# Patient Record
Sex: Female | Born: 1975 | Race: Black or African American | Hispanic: No | Smoking: Current every day smoker
Health system: Southern US, Community
[De-identification: ages and names within clinical notes are randomized; demographics above are authoritative.]

## PROBLEM LIST (undated history)

## (undated) DIAGNOSIS — F410 Panic disorder [episodic paroxysmal anxiety] without agoraphobia: Secondary | ICD-10-CM

## (undated) DIAGNOSIS — R51 Headache: Secondary | ICD-10-CM

## (undated) DIAGNOSIS — K259 Gastric ulcer, unspecified as acute or chronic, without hemorrhage or perforation: Secondary | ICD-10-CM

## (undated) DIAGNOSIS — F419 Anxiety disorder, unspecified: Secondary | ICD-10-CM

## (undated) DIAGNOSIS — F329 Major depressive disorder, single episode, unspecified: Secondary | ICD-10-CM

## (undated) DIAGNOSIS — F32A Depression, unspecified: Secondary | ICD-10-CM

## (undated) DIAGNOSIS — I10 Essential (primary) hypertension: Secondary | ICD-10-CM

## (undated) DIAGNOSIS — R519 Headache, unspecified: Secondary | ICD-10-CM

## (undated) DIAGNOSIS — A6000 Herpesviral infection of urogenital system, unspecified: Secondary | ICD-10-CM

## (undated) DIAGNOSIS — A539 Syphilis, unspecified: Secondary | ICD-10-CM

## (undated) HISTORY — PX: DILATION AND CURETTAGE OF UTERUS: SHX78

---
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
## 1999-02-06 ENCOUNTER — Emergency Department (HOSPITAL_COMMUNITY): Admission: EM | Admit: 1999-02-06 | Discharge: 1999-02-06 | Payer: Self-pay | Admitting: *Deleted

## 1999-09-25 ENCOUNTER — Emergency Department (HOSPITAL_COMMUNITY): Admission: EM | Admit: 1999-09-25 | Discharge: 1999-09-25 | Payer: Self-pay | Admitting: Emergency Medicine

## 1999-09-27 ENCOUNTER — Emergency Department (HOSPITAL_COMMUNITY): Admission: EM | Admit: 1999-09-27 | Discharge: 1999-09-27 | Payer: Self-pay | Admitting: Emergency Medicine

## 1999-09-27 ENCOUNTER — Emergency Department (HOSPITAL_COMMUNITY): Admission: EM | Admit: 1999-09-27 | Discharge: 1999-09-27 | Payer: Self-pay

## 2001-08-20 DIAGNOSIS — I10 Essential (primary) hypertension: Secondary | ICD-10-CM

## 2001-08-20 HISTORY — DX: Essential (primary) hypertension: I10

## 2001-10-24 ENCOUNTER — Encounter: Payer: Self-pay | Admitting: *Deleted

## 2001-10-24 ENCOUNTER — Inpatient Hospital Stay (HOSPITAL_COMMUNITY): Admission: AD | Admit: 2001-10-24 | Discharge: 2001-10-24 | Payer: Self-pay | Admitting: *Deleted

## 2001-11-18 ENCOUNTER — Inpatient Hospital Stay (HOSPITAL_COMMUNITY): Admission: AD | Admit: 2001-11-18 | Discharge: 2001-11-18 | Payer: Self-pay | Admitting: *Deleted

## 2002-06-11 ENCOUNTER — Inpatient Hospital Stay (HOSPITAL_COMMUNITY): Admission: AD | Admit: 2002-06-11 | Discharge: 2002-06-13 | Payer: Self-pay | Admitting: Obstetrics

## 2003-07-21 ENCOUNTER — Emergency Department (HOSPITAL_COMMUNITY): Admission: EM | Admit: 2003-07-21 | Discharge: 2003-07-21 | Payer: Self-pay | Admitting: Emergency Medicine

## 2003-07-28 ENCOUNTER — Emergency Department (HOSPITAL_COMMUNITY): Admission: EM | Admit: 2003-07-28 | Discharge: 2003-07-28 | Payer: Self-pay | Admitting: Emergency Medicine

## 2003-09-09 ENCOUNTER — Emergency Department (HOSPITAL_COMMUNITY): Admission: EM | Admit: 2003-09-09 | Discharge: 2003-09-09 | Payer: Self-pay | Admitting: Emergency Medicine

## 2004-08-12 ENCOUNTER — Emergency Department (HOSPITAL_COMMUNITY): Admission: EM | Admit: 2004-08-12 | Discharge: 2004-08-12 | Payer: Self-pay | Admitting: Emergency Medicine

## 2005-12-03 ENCOUNTER — Inpatient Hospital Stay (HOSPITAL_COMMUNITY): Admission: AD | Admit: 2005-12-03 | Discharge: 2005-12-03 | Payer: Self-pay | Admitting: Obstetrics

## 2005-12-03 ENCOUNTER — Encounter: Payer: Self-pay | Admitting: Emergency Medicine

## 2005-12-06 ENCOUNTER — Inpatient Hospital Stay (HOSPITAL_COMMUNITY): Admission: AD | Admit: 2005-12-06 | Discharge: 2005-12-06 | Payer: Self-pay | Admitting: Obstetrics

## 2005-12-09 ENCOUNTER — Inpatient Hospital Stay (HOSPITAL_COMMUNITY): Admission: AD | Admit: 2005-12-09 | Discharge: 2005-12-09 | Payer: Self-pay | Admitting: Obstetrics

## 2005-12-12 ENCOUNTER — Inpatient Hospital Stay (HOSPITAL_COMMUNITY): Admission: AD | Admit: 2005-12-12 | Discharge: 2005-12-13 | Payer: Self-pay | Admitting: Obstetrics

## 2005-12-12 ENCOUNTER — Inpatient Hospital Stay (HOSPITAL_COMMUNITY): Admission: AD | Admit: 2005-12-12 | Discharge: 2005-12-12 | Payer: Self-pay | Admitting: Obstetrics

## 2005-12-15 ENCOUNTER — Inpatient Hospital Stay (HOSPITAL_COMMUNITY): Admission: AD | Admit: 2005-12-15 | Discharge: 2005-12-15 | Payer: Self-pay | Admitting: Obstetrics

## 2005-12-22 ENCOUNTER — Inpatient Hospital Stay (HOSPITAL_COMMUNITY): Admission: AD | Admit: 2005-12-22 | Discharge: 2005-12-22 | Payer: Self-pay | Admitting: Obstetrics

## 2005-12-29 ENCOUNTER — Inpatient Hospital Stay (HOSPITAL_COMMUNITY): Admission: AD | Admit: 2005-12-29 | Discharge: 2005-12-29 | Payer: Self-pay | Admitting: Obstetrics

## 2006-03-23 ENCOUNTER — Inpatient Hospital Stay (HOSPITAL_COMMUNITY): Admission: AD | Admit: 2006-03-23 | Discharge: 2006-03-23 | Payer: Self-pay | Admitting: Obstetrics

## 2006-05-14 ENCOUNTER — Inpatient Hospital Stay (HOSPITAL_COMMUNITY): Admission: AD | Admit: 2006-05-14 | Discharge: 2006-05-14 | Payer: Self-pay | Admitting: Obstetrics

## 2006-10-14 ENCOUNTER — Inpatient Hospital Stay (HOSPITAL_COMMUNITY): Admission: AD | Admit: 2006-10-14 | Discharge: 2006-10-14 | Payer: Self-pay | Admitting: Obstetrics

## 2006-11-12 ENCOUNTER — Inpatient Hospital Stay (HOSPITAL_COMMUNITY): Admission: AD | Admit: 2006-11-12 | Discharge: 2006-11-15 | Payer: Self-pay | Admitting: Obstetrics

## 2008-08-20 ENCOUNTER — Emergency Department (HOSPITAL_COMMUNITY): Admission: EM | Admit: 2008-08-20 | Discharge: 2008-08-20 | Payer: Self-pay | Admitting: Emergency Medicine

## 2008-12-24 ENCOUNTER — Emergency Department (HOSPITAL_COMMUNITY): Admission: EM | Admit: 2008-12-24 | Discharge: 2008-12-25 | Payer: Self-pay | Admitting: Emergency Medicine

## 2009-08-29 ENCOUNTER — Emergency Department (HOSPITAL_COMMUNITY): Admission: EM | Admit: 2009-08-29 | Discharge: 2009-08-29 | Payer: Self-pay | Admitting: Emergency Medicine

## 2009-09-16 ENCOUNTER — Emergency Department (HOSPITAL_COMMUNITY): Admission: EM | Admit: 2009-09-16 | Discharge: 2009-09-16 | Payer: Self-pay | Admitting: Emergency Medicine

## 2010-06-01 ENCOUNTER — Emergency Department (HOSPITAL_COMMUNITY): Admission: EM | Admit: 2010-06-01 | Discharge: 2010-06-01 | Payer: Self-pay | Admitting: Emergency Medicine

## 2011-05-22 ENCOUNTER — Emergency Department (HOSPITAL_COMMUNITY)
Admission: EM | Admit: 2011-05-22 | Discharge: 2011-05-22 | Disposition: A | Discharge status: Home / Self Care | Payer: BC Managed Care – PPO | Attending: Emergency Medicine | Admitting: Emergency Medicine

## 2011-05-22 DIAGNOSIS — B9789 Other viral agents as the cause of diseases classified elsewhere: Secondary | ICD-10-CM | POA: Insufficient documentation

## 2011-05-22 DIAGNOSIS — F17200 Nicotine dependence, unspecified, uncomplicated: Secondary | ICD-10-CM | POA: Insufficient documentation

## 2011-05-22 DIAGNOSIS — J029 Acute pharyngitis, unspecified: Secondary | ICD-10-CM | POA: Insufficient documentation

## 2011-05-25 ENCOUNTER — Inpatient Hospital Stay (INDEPENDENT_AMBULATORY_CARE_PROVIDER_SITE_OTHER)
Admission: RE | Admit: 2011-05-25 | Discharge: 2011-05-25 | Disposition: A | Discharge status: Home / Self Care | Payer: BC Managed Care – PPO | Source: Ambulatory Visit | Attending: Family Medicine | Admitting: Family Medicine

## 2011-05-25 DIAGNOSIS — R509 Fever, unspecified: Secondary | ICD-10-CM

## 2011-05-25 DIAGNOSIS — J029 Acute pharyngitis, unspecified: Secondary | ICD-10-CM

## 2011-05-25 LAB — POCT PREGNANCY, URINE: Preg Test, Ur: NEGATIVE

## 2011-06-05 ENCOUNTER — Encounter: Payer: Self-pay | Admitting: *Deleted

## 2011-06-05 ENCOUNTER — Emergency Department (HOSPITAL_BASED_OUTPATIENT_CLINIC_OR_DEPARTMENT_OTHER)
Admission: EM | Admit: 2011-06-05 | Discharge: 2011-06-05 | Disposition: A | Discharge status: Home / Self Care | Payer: BC Managed Care – PPO | Attending: Emergency Medicine | Admitting: Emergency Medicine

## 2011-06-05 DIAGNOSIS — IMO0001 Reserved for inherently not codable concepts without codable children: Secondary | ICD-10-CM | POA: Insufficient documentation

## 2011-06-05 DIAGNOSIS — M791 Myalgia, unspecified site: Secondary | ICD-10-CM

## 2011-06-05 DIAGNOSIS — R51 Headache: Secondary | ICD-10-CM | POA: Insufficient documentation

## 2011-06-05 DIAGNOSIS — R110 Nausea: Secondary | ICD-10-CM | POA: Insufficient documentation

## 2011-06-05 MED ORDER — KETOROLAC TROMETHAMINE 30 MG/ML IJ SOLN
30.0000 mg | Freq: Once | INTRAMUSCULAR | Status: AC
  Administered 2011-06-05: 30 mg via INTRAMUSCULAR
  Filled 2011-06-05: qty 1

## 2011-06-05 MED ORDER — DIPHENHYDRAMINE HCL 50 MG/ML IJ SOLN
12.5000 mg | Freq: Once | INTRAMUSCULAR | Status: AC
  Administered 2011-06-05: 12.5 mg via INTRAMUSCULAR
  Filled 2011-06-05: qty 1

## 2011-06-05 MED ORDER — PROMETHAZINE HCL 25 MG/ML IJ SOLN
12.5000 mg | Freq: Once | INTRAMUSCULAR | Status: AC
  Administered 2011-06-05: 12.5 mg via INTRAMUSCULAR
  Filled 2011-06-05: qty 1

## 2011-06-05 MED ORDER — BUTALBITAL-APAP-CAFFEINE 50-325-40 MG PO TABS: 1.0000 | ORAL_TABLET | Freq: Four times a day (QID) | ORAL | Status: DC | PRN

## 2011-06-05 NOTE — ED Notes (Signed)
Resting quietly.states feels better pain now 4/10

## 2011-06-05 NOTE — ED Provider Notes (Signed)
History     CSN: [id number] Arrival date & time: 06/05/2011  1:27 AM  Chief Complaint  Patient presents with  . Generalized Body Aches    (Consider location/radiation/quality/duration/timing/severity/associated sxs/prior treatment) HPI Comments: Patient presents tonight complaining of 3 weeks of multiple symptoms.  She states that she has had fevers and chills for the last 3 weeks that she cannot control with Tylenol and ibuprofen.  She also claims she's had sore throat and cough.  She mentions that she was seen in the [facility] emergency department and diagnosed with viral pharyngitis.  On review of her prior medical record she did have a strep screen completed and it was negative.  Patient also complains of persistent nausea and vomiting.  Notably she was brought in by EMS tonight and there was no vomiting during her transport and has been no vomiting here in the emergency department.  Patient also complains of headaches.  She also complains of diffuse myalgias.  On further review of her medical record she has had another visit to [facility] where she had a chest x-ray completed and this was also normal.  Patient states that Tylenol and ibuprofen did not control her pain or her fevers.  She had been given some hydrocodone elixir for her sore throat which she stated also has not helped.  Patient is a 35 y.o. female presenting with fever. The history is provided by the patient.  Fever Primary symptoms of the febrile illness include fever, headaches, cough, nausea, vomiting and myalgias. Primary symptoms do not include visual change, dysuria, altered mental status or rash. The current episode started more than 1 week ago. The problem has not changed since onset. The headache is not associated with visual change.    History reviewed. No pertinent past medical history.  Past Surgical History  Procedure Date  . Appendectomy     No family history on file.  History  Substance Use  Topics  . Smoking status: Current Everyday Smoker  . Smokeless tobacco: Not on file  . Alcohol Use:     OB History    Grav Para Term Preterm Abortions TAB SAB Ect Mult Living   2 2              Review of Systems  Constitutional: Positive for fever and chills.  HENT: Positive for sore throat.   Eyes: Negative.   Respiratory: Positive for cough.   Gastrointestinal: Positive for nausea and vomiting.  Genitourinary: Negative.  Negative for dysuria, vaginal discharge and pelvic pain.  Musculoskeletal: Positive for myalgias.  Skin: Negative.  Negative for rash.  Neurological: Positive for headaches.  Hematological: Negative.   Psychiatric/Behavioral: Negative.  Negative for altered mental status.  All other systems reviewed and are negative.    Allergies  Review of patient's allergies indicates no known allergies.  Home Medications  No current outpatient prescriptions on file.  BP 148/69  Pulse 89  Temp 98.6 F (37 C)  Resp 16  SpO2 99%  Physical Exam  Constitutional: She is oriented to person, place, and time. She appears well-developed and well-nourished.  Non-toxic appearance. She does not have a sickly appearance.       Patient is lying on the stretcher with her arm over her eyes.  During the entire time in the room the patient never removes her arm from her face.  HENT:  Head: Normocephalic and atraumatic.  Eyes: Conjunctivae, EOM and lids are normal. Pupils are equal, round, and reactive to light.  No scleral icterus.  Neck: Trachea normal and normal range of motion. Neck supple.  Cardiovascular: Normal rate and regular rhythm.  Exam reveals no gallop and no friction rub.   No murmur heard. Pulmonary/Chest: Effort normal and breath sounds normal. No respiratory distress. She has no wheezes. She has no rales. She exhibits no tenderness.  Abdominal: Soft. Normal appearance. There is no tenderness. There is no rebound, no guarding and no CVA tenderness.  Genitourinary:     Pelvic exam was performed with patient supine. There is lesion on the right labia. There is no rash, tenderness or injury on the right labia. There is tenderness on the left labia. There is no rash, lesion or injury on the left labia.       Examination chaperoned byRN  Musculoskeletal: Normal range of motion.  Neurological: She is alert and oriented to person, place, and time. She has normal strength.  Skin: Skin is warm, dry and intact. No rash noted.  Psychiatric: She has a normal mood and affect. Her behavior is normal. Judgment and thought content normal.    ED Course  Procedures (including critical care time)  Labs Reviewed - No data to display No results found.   No diagnosis found.    MDM  Patient feels much better after the medications here.  She is now opening her eyes and has decreased headache and no nausea.  Of note patient has never had any episodes of emesis during her stay here in the ER.  Patient has appeared well and without any significant signs of neck stiffness, fevers, neurologic deficits to suggest that this is a cold meningitis.  Patient is no findings consistent with pharyngitis at this time.  She has clear lungs and no signs of pneumonia.  Patient has a benign abdomen and does not appear to have an acute surgical process in her abdomen at this time.  Patient denies any urinary symptoms.  She does secondarily bring up to bumps on her genital region.  These appear to be any face folliculitis with no signs of other redness or herpes-type lesions.  Patient is being counseled her primary care physician and a primary gynecologist for annual Pap smears.  She understands this and will do so.        Nat Christen, MD 06/05/11 [phone]

## 2011-06-05 NOTE — ED Notes (Signed)
Pt presents to ED today via [facility] for cold/flu like sx for the past 3 weeks.  Pt has been seen at [facility] and [facility] for same.

## 2011-06-05 NOTE — ED Notes (Signed)
Also reports having blisterd on groin area

## 2011-06-11 ENCOUNTER — Encounter (HOSPITAL_COMMUNITY): Payer: Self-pay | Admitting: *Deleted

## 2011-06-11 ENCOUNTER — Inpatient Hospital Stay (HOSPITAL_COMMUNITY)
Admission: AD | Admit: 2011-06-11 | Discharge: 2011-06-11 | Disposition: A | Discharge status: Home / Self Care | Payer: BC Managed Care – PPO | Source: Ambulatory Visit | Attending: Obstetrics & Gynecology | Admitting: Obstetrics & Gynecology

## 2011-06-11 DIAGNOSIS — N898 Other specified noninflammatory disorders of vagina: Secondary | ICD-10-CM | POA: Insufficient documentation

## 2011-06-11 DIAGNOSIS — A6000 Herpesviral infection of urogenital system, unspecified: Secondary | ICD-10-CM | POA: Insufficient documentation

## 2011-06-11 DIAGNOSIS — B009 Herpesviral infection, unspecified: Secondary | ICD-10-CM

## 2011-06-11 LAB — URINALYSIS, ROUTINE W REFLEX MICROSCOPIC
Bilirubin Urine: NEGATIVE
Ketones, ur: 15 mg/dL — AB
Urobilinogen, UA: 0.2 mg/dL (ref 0.0–1.0)

## 2011-06-11 LAB — WET PREP, GENITAL
Trich, Wet Prep: NONE SEEN
Yeast Wet Prep HPF POC: NONE SEEN

## 2011-06-11 LAB — POCT PREGNANCY, URINE: Preg Test, Ur: NEGATIVE

## 2011-06-11 LAB — URINE MICROSCOPIC-ADD ON

## 2011-06-11 MED ORDER — VALACYCLOVIR HCL 1 G PO TABS: 1000.0000 mg | ORAL_TABLET | Freq: Two times a day (BID) | ORAL | Status: DC

## 2011-06-11 MED ORDER — VALACYCLOVIR HCL 1 G PO TABS: 1000.0000 mg | ORAL_TABLET | Freq: Every day | ORAL | Status: DC

## 2011-06-11 MED ORDER — OXYCODONE-ACETAMINOPHEN 5-325 MG PO TABS: 1.0000 | ORAL_TABLET | Freq: Four times a day (QID) | ORAL | Status: DC | PRN

## 2011-06-11 NOTE — ED Provider Notes (Signed)
History     Chief Complaint  Patient presents with  . Vaginal Discharge   HPI Blisters in vaginal area x 1 week. Painful. No period since stopping depo 4 months ago.   OB History    Grav Para Term Preterm Abortions TAB SAB Ect Mult Living   6 2   4 3  1  2       No past medical history on file.  Past Surgical History  Procedure Date  . Appendectomy     No family history on file.  History  Substance Use Topics  . Smoking status: Current Everyday Smoker  . Smokeless tobacco: Not on file  . Alcohol Use:     Allergies: No Known Allergies  Prescriptions prior to admission  Medication Sig Dispense Refill  . butalbital-acetaminophen-caffeine (FIORICET) 50-325-40 MG per tablet Take 1 tablet by mouth every 6 (six) hours as needed for headache.  10 tablet  0    Review of Systems  Constitutional: Negative.   Respiratory: Negative.   Cardiovascular: Negative.   Gastrointestinal: Negative for nausea, vomiting, abdominal pain, diarrhea and constipation.  Genitourinary: Positive for dysuria. Negative for urgency, frequency, hematuria and flank pain.       Negative for vaginal bleeding, vaginal discharge, dyspareunia, + vulvar lesions   Musculoskeletal: Negative.   Neurological: Negative.   Psychiatric/Behavioral: Negative.    Physical Exam   Blood pressure 124/80, pulse 84, temperature 98.4 F (36.9 C), temperature source Oral, resp. rate 20, height 5\' 4"  (1.626 m), weight 61.689 kg (136 lb).  Physical Exam  Nursing note and vitals reviewed. Constitutional: She is oriented to person, place, and time. She appears well-developed and well-nourished. No distress.  HENT:  Head: Normocephalic and atraumatic.  Cardiovascular: Normal rate.   Respiratory: Effort normal. No respiratory distress.  GI: Soft. She exhibits no distension and no mass. There is no tenderness. There is no rebound and no guarding.  Genitourinary:    There is lesion (ulcerated, painful) on the right  labia. There is no rash on the right labia. There is lesion (ulcerated, painful) on the left labia. There is no rash on the left labia. Cervix exhibits motion tenderness. No erythema, tenderness or bleeding around the vagina. No vaginal discharge found.       Speculum exam deferred d/t pain - wet prep collected, GC/CT collected from urine  Lymphadenopathy:       Right: No inguinal adenopathy present.       Left: No inguinal adenopathy present.  Neurological: She is alert and oriented to person, place, and time.  Skin: Skin is warm and dry.  Psychiatric: She has a normal mood and affect.    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

## 2011-06-11 NOTE — Progress Notes (Signed)
Burning when urinates.  Hasn't had a cycle in months.  Has been on depo, last injection  ~ 4 months, was on for a year.  Down on vagina, noted blisters- painful to use restroom.

## 2011-06-12 LAB — GC PROBE AMPLIFICATION, URINE: GC Probe Amp, Urine: NEGATIVE

## 2011-06-13 LAB — HERPES SIMPLEX VIRUS CULTURE: Special Requests: NORMAL

## 2011-10-23 ENCOUNTER — Emergency Department (HOSPITAL_COMMUNITY)
Admission: EM | Admit: 2011-10-23 | Discharge: 2011-10-23 | Disposition: A | Discharge status: Home / Self Care | Payer: BC Managed Care – PPO | Attending: Emergency Medicine | Admitting: Emergency Medicine

## 2011-10-23 ENCOUNTER — Encounter (HOSPITAL_COMMUNITY): Payer: Self-pay | Admitting: *Deleted

## 2011-10-23 DIAGNOSIS — J020 Streptococcal pharyngitis: Secondary | ICD-10-CM | POA: Insufficient documentation

## 2011-10-23 DIAGNOSIS — F17200 Nicotine dependence, unspecified, uncomplicated: Secondary | ICD-10-CM | POA: Insufficient documentation

## 2011-10-23 MED ORDER — DEXAMETHASONE SODIUM PHOSPHATE 10 MG/ML IJ SOLN
10.0000 mg | Freq: Once | INTRAMUSCULAR | Status: AC
  Administered 2011-10-23: 10 mg via INTRAMUSCULAR
  Filled 2011-10-23: qty 1

## 2011-10-23 MED ORDER — IBUPROFEN 800 MG PO TABS
800.0000 mg | ORAL_TABLET | Freq: Once | ORAL | Status: AC
  Administered 2011-10-23: 800 mg via ORAL
  Filled 2011-10-23: qty 1

## 2011-10-23 MED ORDER — PENICILLIN G BENZATHINE 1200000 UNIT/2ML IM SUSP
1.2000 10*6.[IU] | Freq: Once | INTRAMUSCULAR | Status: AC
  Administered 2011-10-23: 1.2 10*6.[IU] via INTRAMUSCULAR
  Filled 2011-10-23: qty 2

## 2011-10-23 NOTE — ED Provider Notes (Signed)
Medical screening examination/treatment/procedure(s) were performed by non-physician practitioner and as supervising physician I was immediately available for consultation/collaboration.  Nicholes Stairs, MD 10/23/11 [phone]

## 2011-10-23 NOTE — Discharge Instructions (Signed)
Strep Throat Strep throat is an infection of the throat caused by a bacteria named Streptococcus pyogenes. Your caregiver may call the infection streptococcal "tonsillitis" or "pharyngitis" depending on whether there are signs of inflammation in the tonsils or back of the throat. Strep throat is most common in children from 5 to 34 years old during the cold months of the year, but it can occur in people of any age during any season. This infection is spread from person to person (contagious) through coughing, sneezing, or other close contact. SYMPTOMS   Fever or chills.   Painful, swollen, red tonsils or throat.   Pain or difficulty when swallowing.   White or yellow spots on the tonsils or throat.   Swollen, tender lymph nodes or "glands" of the neck or under the jaw.   Red rash all over the body (rare).  DIAGNOSIS  Many different infections can cause the same symptoms. A test must be done to confirm the diagnosis so the right treatment can be given. A "rapid strep test" can help your caregiver make the diagnosis in a few minutes. If this test is not available, a light swab of the infected area can be used for a throat culture test. If a throat culture test is done, results are usually available in a day or two. TREATMENT  Strep throat is treated with antibiotic medicine. HOME CARE INSTRUCTIONS   Gargle with 1 tsp of salt in 1 cup of warm water, 3 to 4 times per day or as needed for comfort.   Family members who also have a sore throat or fever should be tested for strep throat and treated with antibiotics if they have the strep infection.   Make sure everyone in your household washes their hands well.   Do not share food, drinking cups, or personal items that could cause the infection to spread to others.   You may need to eat a soft food diet until your sore throat gets better.   Drink enough water and fluids to keep your urine clear or pale yellow. This will help prevent  dehydration.   Get plenty of rest.   Stay home from school, daycare, or work until you have been on antibiotics for 24 hours.   Only take over-the-counter or prescription medicines for pain, discomfort, or fever as directed by your caregiver.   If antibiotics are prescribed, take them as directed. Finish them even if you start to feel better.  SEEK MEDICAL CARE IF:   The glands in your neck continue to enlarge.   You develop a rash, cough, or earache.   You cough up green, yellow-brown, or bloody sputum.   You have pain or discomfort not controlled by medicines.   Your problems seem to be getting worse rather than better.  SEEK IMMEDIATE MEDICAL CARE IF:   You develop any new symptoms such as vomiting, severe headache, stiff or painful neck, chest pain, shortness of breath, or trouble swallowing.   You develop severe throat pain, drooling, or changes in your voice.   You develop swelling of the neck, or the skin on the neck becomes red and tender.   You have a fever.   You develop signs of dehydration, such as fatigue, dry mouth, and decreased urination.   You become increasingly sleepy, or you cannot wake up completely.  Document Released: 08/03/2000 Document Revised: 07/26/2011 Document Reviewed: 10/05/2010 Exit[employer] Patient Information 2012 [employer].Salt Water Gargle This solution will help make your mouth and throat   feel better. HOME CARE INSTRUCTIONS   Mix 1 teaspoon of salt in 8 ounces of warm water.   Gargle with this solution as much or often as you need or as directed. Swish and gargle gently if you have any sores or wounds in your mouth.   Do not swallow this mixture.  Document Released: 05/10/2004 Document Revised: 07/26/2011 Document Reviewed: 10/01/2008 [employer] Patient Information 2012 [employer]. 

## 2011-10-23 NOTE — ED Provider Notes (Signed)
History     CSN: [id number]  Arrival date & time 10/23/11  1619   First MD Initiated Contact with Patient 10/23/11 1731      Chief Complaint  Patient presents with  . Sore Throat    (Consider location/radiation/quality/duration/timing/severity/associated sxs/prior treatment) HPI Comments: Patient here with worsening sore throat - states that this initially started as "cold like symptoms" - states worsening nasal congestion and sore throat over the past several days - no recent sick contacts - reports fever chills, denies cough or sputum production,  Patient is a 36 y.o. female presenting with pharyngitis. The history is provided by the patient. No language interpreter was used.  Sore Throat This is a new problem. The current episode started in the past 7 days. The problem occurs constantly. The problem has been unchanged. Associated symptoms include congestion, a fever, headaches, a sore throat and swollen glands. Pertinent negatives include no abdominal pain, anorexia, arthralgias, change in bowel habit, chest pain, chills, coughing, diaphoresis, fatigue, joint swelling, myalgias, nausea, neck pain, numbness, rash, urinary symptoms, vertigo, visual change, vomiting or weakness. The symptoms are aggravated by swallowing. She has tried nothing for the symptoms. The treatment provided no relief.    Past Medical History  Diagnosis Date  . No pertinent past medical history     Past Surgical History  Procedure Date  . Appendectomy   . Dilation and curettage of uterus     History reviewed. No pertinent family history.  History  Substance Use Topics  . Smoking status: Current Everyday Smoker  . Smokeless tobacco: Not on file  . Alcohol Use: No    OB History    Grav Para Term Preterm Abortions TAB SAB Ect Mult Living   6 2   4 3  1  2       Review of Systems  Constitutional: Positive for fever. Negative for chills, diaphoresis and fatigue.  HENT: Positive for congestion and  sore throat. Negative for neck pain.   Respiratory: Negative for cough.   Cardiovascular: Negative for chest pain.  Gastrointestinal: Negative for nausea, vomiting, abdominal pain, anorexia and change in bowel habit.  Musculoskeletal: Negative for myalgias, joint swelling and arthralgias.  Skin: Negative for rash.  Neurological: Positive for headaches. Negative for vertigo, weakness and numbness.  All other systems reviewed and are negative.    Allergies  Review of patient's allergies indicates no known allergies.  Home Medications   Current Outpatient Rx  Name Route Sig Dispense Refill  . ACETAMINOPHEN 500 MG PO TABS Oral Take 1,000 mg by mouth every 6 (six) hours as needed. For pain. Taking for fever.    . IBUPROFEN 200 MG PO TABS Oral Take 800 mg by mouth every 6 (six) hours as needed. Patient states that she used this medication for pain of swollen glands in neck.      Marland Kitchen VALACYCLOVIR HCL 1 G PO TABS Oral Take 1,000 mg by mouth daily. Take daily for prevention of outbreaks.      BP 139/80  Pulse 85  Temp(Src) 99 F (37.2 C) (Oral)  Resp 20  SpO2 100%  Physical Exam  Nursing note and vitals reviewed. Constitutional: She is oriented to person, place, and time. She appears well-developed and well-nourished. No distress.  HENT:  Head: Normocephalic and atraumatic.  Right Ear: External ear normal.  Left Ear: External ear normal.  Mouth/Throat: Oropharyngeal exudate present.       Enlarged tonsils with exudate and erythema - nasal bogginess.  Eyes: Conjunctivae  are normal. Pupils are equal, round, and reactive to light. No scleral icterus.  Neck: Normal range of motion. Neck supple.       Anterior cervical adenopathy  Cardiovascular: Normal rate, regular rhythm and normal heart sounds.  Exam reveals no gallop and no friction rub.   No murmur heard. Pulmonary/Chest: Effort normal and breath sounds normal. No respiratory distress. She has no wheezes. She has no rales. She  exhibits no tenderness.  Abdominal: Soft. Bowel sounds are normal. She exhibits no distension. There is no tenderness.  Musculoskeletal: Normal range of motion. She exhibits no edema and no tenderness.  Lymphadenopathy:    She has cervical adenopathy.  Neurological: She is alert and oriented to person, place, and time. No cranial nerve deficit.  Skin: Skin is warm and dry. No rash noted. No erythema. No pallor.  Psychiatric: She has a normal mood and affect. Her behavior is normal. Judgment and thought content normal.    ED Course  Procedures (including critical care time)  Labs Reviewed - No data to display No results found.   Strep pharyngitis    MDM  Patient with a CENTOR score of 4/4 - will treat for strep - she would like an injection, so given bicillin LA 1.2 million units - also given an injection of decadron IM for pain control.       Izola Price [place], [place] 10/23/11 [phone]

## 2011-10-23 NOTE — ED Notes (Signed)
Pt in c/o sore throat x1.5 weeks

## 2011-10-23 NOTE — ED Notes (Signed)
Pt reports sore throat x 1.5 week.  Denies any fever.

## 2011-11-08 ENCOUNTER — Emergency Department (INDEPENDENT_AMBULATORY_CARE_PROVIDER_SITE_OTHER)
Admission: EM | Admit: 2011-11-08 | Discharge: 2011-11-08 | Disposition: A | Discharge status: Home / Self Care | Payer: BC Managed Care – PPO | Source: Home / Self Care | Attending: Family Medicine | Admitting: Family Medicine

## 2011-11-08 ENCOUNTER — Encounter (HOSPITAL_COMMUNITY): Payer: Self-pay | Admitting: Emergency Medicine

## 2011-11-08 DIAGNOSIS — Z8719 Personal history of other diseases of the digestive system: Secondary | ICD-10-CM

## 2011-11-08 MED ORDER — HYDROCODONE-ACETAMINOPHEN 5-325 MG PO TABS: 1.0000 | ORAL_TABLET | Freq: Four times a day (QID) | ORAL | Status: AC | PRN

## 2011-11-08 NOTE — ED Notes (Signed)
Pt c/o mouth sores X 4 days. Pt stated she had strep last week. Pt tried antiseptic mouth cleanser and Oragel, no relief.

## 2011-11-08 NOTE — ED Provider Notes (Signed)
History     CSN: [id number]  Arrival date & time 11/08/11  1521   First MD Initiated Contact with Patient 11/08/11 1533      Chief Complaint  Patient presents with  . Mouth Lesions    (Consider location/radiation/quality/duration/timing/severity/associated sxs/prior treatment) HPI Comments: The patient reports having painful oral lesions x 4 days. Has had similar episodes in the past. Has hx of genital herpes and take valtrex daily. No fever, no cough or cold symptoms.   The history is provided by the patient.    Past Medical History  Diagnosis Date  . No pertinent past medical history   . Herpes     Past Surgical History  Procedure Date  . Appendectomy   . Dilation and curettage of uterus     History reviewed. No pertinent family history.  History  Substance Use Topics  . Smoking status: Current Everyday Smoker -- 1.0 packs/day    Types: Cigarettes  . Smokeless tobacco: Not on file  . Alcohol Use: No    OB History    Grav Para Term Preterm Abortions TAB SAB Ect Mult Living   6 2   4 3  1  2       Review of Systems  Constitutional: Negative.   HENT: Positive for mouth sores.   Respiratory: Negative.   Cardiovascular: Negative.   Gastrointestinal: Negative.   Genitourinary: Negative.     Allergies  Review of patient's allergies indicates no known allergies.  Home Medications   Current Outpatient Rx  Name Route Sig Dispense Refill  . ACETAMINOPHEN 500 MG PO TABS Oral Take 1,000 mg by mouth every 6 (six) hours as needed. For pain. Taking for fever.    . IBUPROFEN 200 MG PO TABS Oral Take 800 mg by mouth every 6 (six) hours as needed. Patient states that she used this medication for pain of swollen glands in neck.      Marland Kitchen VALACYCLOVIR HCL 1 G PO TABS Oral Take 1,000 mg by mouth daily. Take daily for prevention of outbreaks.      BP 129/85  Pulse 80  Temp(Src) 100.8 F (38.2 C) (Oral)  Resp 14  SpO2 97%  LMP 10/15/2011  Physical Exam  Nursing  note and vitals reviewed. Constitutional: She appears well-developed and well-nourished. No distress.  HENT:  Head: Atraumatic.       Multiple ulcers on mucosa of lip , tongue consistent with aphthous ulcers.   Neck: Normal range of motion. Neck supple.  Cardiovascular: Normal rate and regular rhythm.   Pulmonary/Chest: Effort normal and breath sounds normal.  Abdominal: Soft. Bowel sounds are normal.  Skin: Skin is warm and dry.    ED Course  Procedures (including critical car  Labs Reviewed - No data to display No results found.  Painted the lesions with viscus xylocaine 1. H/O oral aphthous ulcers       MDM          Randa Spike, MD 11/08/11 1626

## 2011-11-08 NOTE — Discharge Instructions (Signed)
Apply the medication given to you as needed before meals and at bedtime. Avoid spicy foods and salty foods.

## 2012-03-27 ENCOUNTER — Other Ambulatory Visit: Payer: Self-pay | Admitting: Obstetrics

## 2012-04-02 ENCOUNTER — Ambulatory Visit (HOSPITAL_COMMUNITY): Admission: EM | Admit: 2012-04-02 | Payer: BC Managed Care – PPO | Source: Ambulatory Visit | Admitting: Obstetrics

## 2012-04-02 ENCOUNTER — Encounter (HOSPITAL_COMMUNITY): Admission: EM | Payer: Self-pay | Source: Ambulatory Visit

## 2012-04-02 SURGERY — LIGATION, FALLOPIAN TUBE, LAPAROSCOPIC
Anesthesia: General | Laterality: Bilateral

## 2012-08-12 ENCOUNTER — Other Ambulatory Visit: Payer: Self-pay | Admitting: Advanced Practice Midwife

## 2012-08-14 ENCOUNTER — Other Ambulatory Visit: Payer: Self-pay | Admitting: *Deleted

## 2012-08-14 NOTE — Telephone Encounter (Signed)
Called Michele Yu as requested and left message that her refill has been sent to the pharmacy- call if any questions

## 2012-08-14 NOTE — Telephone Encounter (Signed)
Need to call patient re: refill

## 2012-08-19 ENCOUNTER — Emergency Department (HOSPITAL_COMMUNITY)
Admission: EM | Admit: 2012-08-19 | Discharge: 2012-08-20 | Disposition: A | Discharge status: Home / Self Care | Payer: BC Managed Care – PPO | Attending: Emergency Medicine | Admitting: Emergency Medicine

## 2012-08-19 ENCOUNTER — Encounter (HOSPITAL_COMMUNITY): Payer: Self-pay | Admitting: *Deleted

## 2012-08-19 DIAGNOSIS — F17200 Nicotine dependence, unspecified, uncomplicated: Secondary | ICD-10-CM | POA: Insufficient documentation

## 2012-08-19 DIAGNOSIS — Z8619 Personal history of other infectious and parasitic diseases: Secondary | ICD-10-CM | POA: Insufficient documentation

## 2012-08-19 DIAGNOSIS — Z9889 Other specified postprocedural states: Secondary | ICD-10-CM | POA: Insufficient documentation

## 2012-08-19 DIAGNOSIS — R509 Fever, unspecified: Secondary | ICD-10-CM | POA: Insufficient documentation

## 2012-08-19 DIAGNOSIS — N938 Other specified abnormal uterine and vaginal bleeding: Secondary | ICD-10-CM | POA: Insufficient documentation

## 2012-08-19 DIAGNOSIS — N949 Unspecified condition associated with female genital organs and menstrual cycle: Secondary | ICD-10-CM | POA: Insufficient documentation

## 2012-08-19 DIAGNOSIS — Z3202 Encounter for pregnancy test, result negative: Secondary | ICD-10-CM | POA: Insufficient documentation

## 2012-08-19 NOTE — ED Notes (Signed)
Pt c/o vaginal bleeding, starting as a normal period and then began passing a large clot the size of a "baseball". Pt denies prior sxs of excessive vaginal bleeding. Pt drove self to hospital. Pt denies pain, n/v/d.

## 2012-08-19 NOTE — ED Provider Notes (Signed)
History     CSN: [id number]  Arrival date & time 08/19/12  2120   First MD Initiated Contact with Patient 08/19/12 2234      Chief Complaint  Patient presents with  . Vaginal Bleeding  . Fever    (Consider location/radiation/quality/duration/timing/severity/associated sxs/prior treatment) HPI History provided by pt.   Pt reports heavy vaginal bleeding w/ fist-sized clots since this afternoon.  LMP 2 weeks ago and patients are typically regular and not heavy.  No associated abdominal pain, other GU sx, lightheadedness, generalized weakness or SOB.  Pt is not anti-coagulated.  Denies trauma to genitalia.  Past Medical History  Diagnosis Date  . No pertinent past medical history   . Herpes     Past Surgical History  Procedure Date  . Appendectomy   . Dilation and curettage of uterus     History reviewed. No pertinent family history.  History  Substance Use Topics  . Smoking status: Current Every Day Smoker -- 1.0 packs/day    Types: Cigarettes  . Smokeless tobacco: Not on file  . Alcohol Use: No    OB History    Grav Para Term Preterm Abortions TAB SAB Ect Mult Living   6 2   4 3  1  2       Review of Systems  All other systems reviewed and are negative.    Allergies  Review of patient's allergies indicates no known allergies.  Home Medications   Current Outpatient Rx  Name  Route  Sig  Dispense  Refill  . ACETAMINOPHEN 500 MG PO TABS   Oral   Take 1,000 mg by mouth every 6 (six) hours as needed. For pain.         . IBUPROFEN 200 MG PO TABS   Oral   Take 800 mg by mouth every 8 (eight) hours as needed. Patient states that she used this medication for pain of swollen glands in neck.          . ADULT MULTIVITAMIN W/MINERALS CH   Oral   Take 1 tablet by mouth daily.         Marland Kitchen VALACYCLOVIR HCL 1 G PO TABS   Oral   Take 1,000 mg by mouth daily.           BP 129/82  Pulse 120  Temp 100.1 F (37.8 C) (Oral)  Resp 20  SpO2 100%  LMP  06/15/2012  Physical Exam  Nursing note and vitals reviewed. Constitutional: She is oriented to person, place, and time. She appears well-developed and well-nourished. No distress.  HENT:  Head: Normocephalic and atraumatic.  Eyes:       Normal appearance  Neck: Normal range of motion.  Cardiovascular: Normal rate and regular rhythm.   Pulmonary/Chest: Effort normal and breath sounds normal. No respiratory distress.  Abdominal: Soft. Bowel sounds are normal. She exhibits no distension and no mass. There is no tenderness. There is no rebound and no guarding.  Genitourinary:       No CVA tenderness.  Large clots externally and within vagina.  Exam limited by blood and patients intolerance.  Right adnexal ttp.   Musculoskeletal: Normal range of motion.  Neurological: She is alert and oriented to person, place, and time.  Skin: Skin is warm and dry. No rash noted.  Psychiatric: She has a normal mood and affect. Her behavior is normal.    ED Course  Procedures (including critical care time)  Labs Reviewed  BASIC METABOLIC PANEL - Abnormal;  Notable for the following:    Potassium 3.3 (*)     Glucose, Bld 105 (*)     All other components within normal limits  PREGNANCY, URINE  CBC WITH DIFFERENTIAL   US Transvaginal Non-ob  08/20/2012  *RADIOLOGY REPORT*  Clinical Data: Heavy vaginal bleeding and right adnexal tenderness.  TRANSABDOMINAL AND TRANSVAGINAL ULTRASOUND OF PELVIS Technique:  Both transabdominal and transvaginal ultrasound examinations of the pelvis were performed. Transabdominal technique was performed for global imaging of the pelvis including uterus, ovaries, adnexal regions, and pelvic cul-de-sac.  It was necessary to proceed with endovaginal exam following the transabdominal exam to visualize the uterus and ovaries in greater detail.  Comparison:  Pelvic ultrasound performed 03/23/2006  Findings:  Uterus: Normal in size and appearance; measures 7.1 x 3.1 x 4.4 cm.   Endometrium: Normal in thickness and appearance; measures 0.3 cm in thickness.  Right ovary:  Normal appearance/no adnexal mass, though difficult to fully characterize due to its lateral position; measures 3.0 x 2.4 x 1.8 cm.  Left ovary: Normal appearance/no adnexal mass, though difficult to characterize due to its lateral position; measures approximately 2.5 cm in size.  Other findings: No free fluid is seen in the pelvic cul-de-sac.  IMPRESSION: Normal study.  No evidence of pelvic mass or other significant abnormality.  Right ovary unremarkable in appearance.   Original Report Authenticated By: Tonia Ghent, M.D.    US Pelvis Complete  08/20/2012  *RADIOLOGY REPORT*  Clinical Data: Heavy vaginal bleeding and right adnexal tenderness.  TRANSABDOMINAL AND TRANSVAGINAL ULTRASOUND OF PELVIS Technique:  Both transabdominal and transvaginal ultrasound examinations of the pelvis were performed. Transabdominal technique was performed for global imaging of the pelvis including uterus, ovaries, adnexal regions, and pelvic cul-de-sac.  It was necessary to proceed with endovaginal exam following the transabdominal exam to visualize the uterus and ovaries in greater detail.  Comparison:  Pelvic ultrasound performed 03/23/2006  Findings:  Uterus: Normal in size and appearance; measures 7.1 x 3.1 x 4.4 cm.  Endometrium: Normal in thickness and appearance; measures 0.3 cm in thickness.  Right ovary:  Normal appearance/no adnexal mass, though difficult to fully characterize due to its lateral position; measures 3.0 x 2.4 x 1.8 cm.  Left ovary: Normal appearance/no adnexal mass, though difficult to characterize due to its lateral position; measures approximately 2.5 cm in size.  Other findings: No free fluid is seen in the pelvic cul-de-sac.  IMPRESSION: Normal study.  No evidence of pelvic mass or other significant abnormality.  Right ovary unremarkable in appearance.   Original Report Authenticated By: Tonia Ghent, M.D.        1. DUB (dysfunctional uterine bleeding)       MDM  Healthy 36yo F presents w/ heavy vaginal bleeding.  Exam limited but is sig for normal vital signs, heavy vaginal bleeding, R adnexal ttp, benign abdomen.  She is not pregnant.  Has had no symptoms of anemia and hgb nml.  Transvaginal US unremarkable.  All results discussed w/ patient.  D/c'd home w/ 10d course of provera.  I advised close f/u with her gynecologist.  Return precautions including syncope, lightheadedness, SOB discussed.           Otilio Miu, PA-C 08/20/12 [phone]

## 2012-08-20 ENCOUNTER — Emergency Department (HOSPITAL_COMMUNITY): Payer: BC Managed Care – PPO

## 2012-08-20 LAB — BASIC METABOLIC PANEL
CO2: 27 mEq/L (ref 19–32)
Chloride: 100 mEq/L (ref 96–112)
Creatinine, Ser: 0.65 mg/dL (ref 0.50–1.10)
GFR calc Af Amer: >90 mL/min (ref >90)
Sodium: 136 mEq/L (ref 135–145)

## 2012-08-20 LAB — CBC WITH DIFFERENTIAL/PLATELET
Basophils Absolute: 0 10*3/uL (ref 0.0–0.1)
Basophils Relative: 0 % (ref 0–1)
Lymphocytes Relative: 33 % (ref 12–46)
MCHC: 34.8 g/dL (ref 30.0–36.0)
Neutro Abs: 5.7 10*3/uL (ref 1.7–7.7)
Neutrophils Relative %: 55 % (ref 43–77)
Platelets: 306 10*3/uL (ref 150–400)
RDW: 12.9 % (ref 11.5–15.5)
WBC: 10.3 10*3/uL (ref 4.0–10.5)

## 2012-08-20 LAB — PREGNANCY, URINE: Preg Test, Ur: NEGATIVE

## 2012-08-20 MED ORDER — MEDROXYPROGESTERONE ACETATE 5 MG PO TABS: 5.0000 mg | ORAL_TABLET | Freq: Every day | ORAL | Status: DC

## 2012-08-20 NOTE — ED Notes (Signed)
PA speaking with pt at this time

## 2012-08-20 NOTE — ED Provider Notes (Signed)
Medical screening examination/treatment/procedure(s) were performed by non-physician practitioner and as supervising physician I was immediately available for consultation/collaboration.   Gwyneth Sprout, MD 08/20/12 [phone]

## 2012-08-20 NOTE — ED Notes (Signed)
Pt taken to US

## 2013-05-13 ENCOUNTER — Emergency Department (HOSPITAL_COMMUNITY)
Admission: EM | Admit: 2013-05-13 | Discharge: 2013-05-13 | Disposition: A | Discharge status: Home / Self Care | Payer: BC Managed Care – PPO | Attending: Emergency Medicine | Admitting: Emergency Medicine

## 2013-05-13 ENCOUNTER — Encounter (HOSPITAL_COMMUNITY): Payer: Self-pay | Admitting: Emergency Medicine

## 2013-05-13 DIAGNOSIS — F329 Major depressive disorder, single episode, unspecified: Secondary | ICD-10-CM | POA: Insufficient documentation

## 2013-05-13 DIAGNOSIS — F411 Generalized anxiety disorder: Secondary | ICD-10-CM | POA: Insufficient documentation

## 2013-05-13 DIAGNOSIS — Z3202 Encounter for pregnancy test, result negative: Secondary | ICD-10-CM | POA: Insufficient documentation

## 2013-05-13 DIAGNOSIS — Z8619 Personal history of other infectious and parasitic diseases: Secondary | ICD-10-CM | POA: Insufficient documentation

## 2013-05-13 DIAGNOSIS — F419 Anxiety disorder, unspecified: Secondary | ICD-10-CM

## 2013-05-13 DIAGNOSIS — Z79899 Other long term (current) drug therapy: Secondary | ICD-10-CM | POA: Insufficient documentation

## 2013-05-13 DIAGNOSIS — F17200 Nicotine dependence, unspecified, uncomplicated: Secondary | ICD-10-CM | POA: Insufficient documentation

## 2013-05-13 DIAGNOSIS — F3289 Other specified depressive episodes: Secondary | ICD-10-CM | POA: Insufficient documentation

## 2013-05-13 LAB — COMPREHENSIVE METABOLIC PANEL
ALT: 15 U/L (ref 0–35)
AST: 17 U/L (ref 0–37)
Alkaline Phosphatase: 107 U/L (ref 39–117)
CO2: 26 mEq/L (ref 19–32)
Chloride: 99 mEq/L (ref 96–112)
GFR calc Af Amer: >90 mL/min (ref >90)
GFR calc non Af Amer: >90 mL/min (ref >90)
Glucose, Bld: 102 mg/dL — ABNORMAL HIGH (ref 70–99)
Sodium: 137 mEq/L (ref 135–145)
Total Bilirubin: 0.4 mg/dL (ref 0.3–1.2)

## 2013-05-13 LAB — PREGNANCY, URINE: Preg Test, Ur: NEGATIVE

## 2013-05-13 LAB — RAPID URINE DRUG SCREEN, HOSP PERFORMED
Amphetamines: NOT DETECTED
Barbiturates: NOT DETECTED
Benzodiazepines: NOT DETECTED
Tetrahydrocannabinol: POSITIVE — AB

## 2013-05-13 LAB — CBC WITH DIFFERENTIAL/PLATELET
Basophils Absolute: 0 10*3/uL (ref 0.0–0.1)
Eosinophils Relative: 1 % (ref 0–5)
HCT: 41.2 % (ref 36.0–46.0)
Lymphocytes Relative: 39 % (ref 12–46)
Lymphs Abs: 3 10*3/uL (ref 0.7–4.0)
MCV: 90.7 fL (ref 78.0–100.0)
Monocytes Absolute: 0.5 10*3/uL (ref 0.1–1.0)
Neutro Abs: 4 10*3/uL (ref 1.7–7.7)
Platelets: 303 10*3/uL (ref 150–400)
RBC: 4.54 MIL/uL (ref 3.87–5.11)
RDW: 13.4 % (ref 11.5–15.5)
WBC: 7.7 10*3/uL (ref 4.0–10.5)

## 2013-05-13 LAB — URINALYSIS, ROUTINE W REFLEX MICROSCOPIC
Bilirubin Urine: NEGATIVE
Glucose, UA: NEGATIVE mg/dL
Hgb urine dipstick: NEGATIVE
Ketones, ur: NEGATIVE mg/dL
Protein, ur: NEGATIVE mg/dL
Urobilinogen, UA: 1 mg/dL (ref 0.0–1.0)

## 2013-05-13 LAB — URINE MICROSCOPIC-ADD ON

## 2013-05-13 MED ORDER — ACETAMINOPHEN 325 MG PO TABS: 650.0000 mg | ORAL_TABLET | ORAL | Status: DC | PRN

## 2013-05-13 MED ORDER — ALUM & MAG HYDROXIDE-SIMETH 200-200-20 MG/5ML PO SUSP: 30.0000 mL | ORAL | Status: DC | PRN

## 2013-05-13 MED ORDER — NICOTINE 21 MG/24HR TD PT24: 21.0000 mg | MEDICATED_PATCH | Freq: Every day | TRANSDERMAL | Status: DC

## 2013-05-13 MED ORDER — IBUPROFEN 200 MG PO TABS: 600.0000 mg | ORAL_TABLET | Freq: Three times a day (TID) | ORAL | Status: DC | PRN

## 2013-05-13 MED ORDER — ZOLPIDEM TARTRATE 10 MG PO TABS: 10.0000 mg | ORAL_TABLET | Freq: Every evening | ORAL | Status: DC | PRN

## 2013-05-13 MED ORDER — ONDANSETRON HCL 4 MG PO TABS: 4.0000 mg | ORAL_TABLET | Freq: Three times a day (TID) | ORAL | Status: DC | PRN

## 2013-05-13 NOTE — ED Notes (Addendum)
Pt states hx of anxiety.  States that she feels like she is having a panic attack.  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  States that she wants a therapist to talk to on a regular basis.  Denies SI/HI.  States that she is unsure how many abortions she has had but she guesses about 5 or 6.

## 2013-05-13 NOTE — Progress Notes (Signed)
[facility] ED CM noted pt with blue cross and blue shield coverage but no pcp listed Spoke with pt who confirms no pcp  [facility] ED CM spoke with pt on how to obtain an in network pcp with insurance coverage via the customer service number or web site  Cm reviewed ED level of care for crisis/emergent services and community pcp level of care to manage continuous or chronic medical concerns.  The pt voiced understanding CM encouraged pt and discussed pt's responsibility to verify with pt's insurance carrier that any recommended medical provider offered by any emergency room or a hospital provider is within the carrier's network. The pt voiced understanding  

## 2013-05-13 NOTE — ED Provider Notes (Signed)
CSN: [id number]     Arrival date & time 05/13/13  1007 History   First MD Initiated Contact with Patient 05/13/13 1032     Chief Complaint  Patient presents with  . Anxiety   (Consider location/radiation/quality/duration/timing/severity/associated sxs/prior Treatment) HPI  Patient reports she feels in retrospect she started having anxiety when she was a teenager. She states she started having worsening of her symptoms 5 days ago. She states she feels short of breath, she gets panicked, and "I have high anxiety". She states her mind is racing. She is unable to sleep unless she takes Benadryl. She states she's never sought treatment in the past. She also reports she feels depressed "always". She states she's been in and out of depression most of her life. Again she's never sought treatment for this. She denies suicidal ideation, stating she would not hurt her self because of her 2 children who are aged 36 and 14. She also states it is against her religious belief. She also denies homicidal ideation. She reports increasing stress mainly at work. 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 She reports the administration changed last year and she feels it has made a big difference. She reports last year she requested to change grades and this year she is teaching third grade. She reports having difficulty with parents, and administration but does not elaborate. She states she feels like "I just want to walk out and quit". She however states she wants to continue teaching but feels like she cannot stay at her current school. She also reports she is P8 G2 AB 6 (1 tubal pregnancy, and 5 abortions, the last abortion was this summer). She also reports a lot of stress related to the abortion she had this  summer. She reports this morning she when she put her 2 children aged 54 and 32 on their school bus she burst out into tears. Patient states she feels like she needs to see a therapist. She is not sure if she needs to be admitted for treatment. She states she is not eating, she is not sleeping, and she feels depressed. She denies any weight loss.   Past Medical History  Diagnosis Date  . No pertinent past medical history   . Herpes    Past Surgical History  Procedure Laterality Date  . Appendectomy    . Dilation and curettage of uterus     No family history on file. History  Substance Use Topics  . Smoking status: Current Every Day Smoker -- 1.00 packs/day    Types: Cigarettes  . Smokeless tobacco: Not on file  . Alcohol Use: No   Lives at home Lives with spouse employed   OB History   Grav Para Term Preterm Abortions TAB SAB Ect Mult Living   6 2   4 3  1  2      Review of Systems  All other systems reviewed and are negative.    Allergies  Review of patient's allergies indicates no known allergies.  Home Medications   Current Outpatient Rx  Name  Route  Sig  Dispense  Refill  . acetaminophen (TYLENOL) 500 MG tablet   Oral   Take 1,000 mg by mouth every 6 (six) hours as needed.  For pain.         Marland Kitchen ibuprofen (ADVIL,MOTRIN) 200 MG tablet   Oral   Take 800 mg by mouth every 8 (eight) hours as needed. Patient states that she used this medication for pain of swollen glands in neck.          . medroxyPROGESTERone (PROVERA) 5 MG tablet   Oral   Take 1 tablet (5 mg total) by mouth daily.   10 tablet   0   . Multiple Vitamin (MULTIVITAMIN WITH MINERALS) TABS   Oral   Take 1 tablet by mouth daily.         . valACYclovir (VALTREX) 1000 MG tablet   Oral   Take 1,000 mg by mouth daily.          BP 125/99  Pulse 106  Temp(Src) 98.2 F (36.8 C) (Oral)  Resp 20  SpO2 99%  Vital signs normal except tachycardia  Physical Exam  Nursing note and vitals  reviewed. Constitutional: She is oriented to person, place, and time. She appears well-developed and well-nourished.  Non-toxic appearance. She does not appear ill. No distress.  HENT:  Head: Normocephalic and atraumatic.  Right Ear: External ear normal.  Left Ear: External ear normal.  Nose: Nose normal. No mucosal edema or rhinorrhea.  Mouth/Throat: Oropharynx is clear and moist and mucous membranes are normal. No dental abscesses or edematous.  Eyes: Conjunctivae and EOM are normal. Pupils are equal, round, and reactive to light.  Neck: Normal range of motion and full passive range of motion without pain. Neck supple.  Cardiovascular: Normal rate, regular rhythm and normal heart sounds.  Exam reveals no gallop and no friction rub.   No murmur heard. Pulmonary/Chest: Effort normal and breath sounds normal. No respiratory distress. She has no wheezes. She has no rhonchi. She has no rales. She exhibits no tenderness and no crepitus.  Abdominal: Soft. Normal appearance and bowel sounds are normal. She exhibits no distension. There is no tenderness. There is no rebound and no guarding.  Musculoskeletal: Normal range of motion. She exhibits no edema and no tenderness.  Moves all extremities well.   Neurological: She is alert and oriented to person, place, and time. She has normal strength. No cranial nerve deficit.  Skin: Skin is warm, dry and intact. No rash noted. No erythema. No pallor.  Psychiatric: Her speech is normal. Thought content normal. Her mood appears anxious. Her affect is labile. Cognition and memory are normal. She exhibits a depressed mood.  Appears anxious then becomes tearful then starts sobbing    ED Course  Procedures (including critical care time)  Pt decided she didn't want to wait and talk to our mental health worker. She has also decided she doesn't want to be admitted. She has called the number for her work's crisis intervention and she is going to get 5 counseling  sessions for free. She feels ready to be discharged. Pt has no SI or HI and can be discharged to  go home.    Labs Review  Results for orders placed during the hospital encounter of 05/13/13  CBC WITH DIFFERENTIAL      Result Value Range   WBC 7.7  4.0 - 10.5 K/uL   RBC 4.54  3.87 - 5.11 MIL/uL   Hemoglobin 14.5  12.0 - 15.0 g/dL   HCT 16.1  09.6 - 04.5 %   MCV 90.7  78.0 - 100.0 fL   MCH 31.9  26.0 - 34.0 pg  MCHC 35.2  30.0 - 36.0 g/dL   RDW 54.0  98.1 - 19.1 %   Platelets 303  150 - 400 K/uL   Neutrophils Relative % 52  43 - 77 %   Neutro Abs 4.0  1.7 - 7.7 K/uL   Lymphocytes Relative 39  12 - 46 %   Lymphs Abs 3.0  0.7 - 4.0 K/uL   Monocytes Relative 7  3 - 12 %   Monocytes Absolute 0.5  0.1 - 1.0 K/uL   Eosinophils Relative 1  0 - 5 %   Eosinophils Absolute 0.1  0.0 - 0.7 K/uL   Basophils Relative 1  0 - 1 %   Basophils Absolute 0.0  0.0 - 0.1 K/uL  COMPREHENSIVE METABOLIC PANEL      Result Value Range   Sodium 137  135 - 145 mEq/L   Potassium 4.6  3.5 - 5.1 mEq/L   Chloride 99  96 - 112 mEq/L   CO2 26  19 - 32 mEq/L   Glucose, Bld 102 (*) 70 - 99 mg/dL   BUN 7  6 - 23 mg/dL   Creatinine, Ser 4.78  0.50 - 1.10 mg/dL   Calcium 9.5  8.4 - 29.5 mg/dL   Total Protein 8.4 (*) 6.0 - 8.3 g/dL   Albumin 3.9  3.5 - 5.2 g/dL   AST 17  0 - 37 U/L   ALT 15  0 - 35 U/L   Alkaline Phosphatase 107  39 - 117 U/L   Total Bilirubin 0.4  0.3 - 1.2 mg/dL   GFR calc non Af Amer >90  >90 mL/min   GFR calc Af Amer >90  >90 mL/min  ETHANOL      Result Value Range   Alcohol, Ethyl (B) <11  0 - 11 mg/dL  URINALYSIS, ROUTINE W REFLEX MICROSCOPIC      Result Value Range   Color, Urine YELLOW  YELLOW   APPearance CLEAR  CLEAR   Specific Gravity, Urine 1.024  1.005 - 1.030   pH 6.0  5.0 - 8.0   Glucose, UA NEGATIVE  NEGATIVE mg/dL   Hgb urine dipstick NEGATIVE  NEGATIVE   Bilirubin Urine NEGATIVE  NEGATIVE   Ketones, ur NEGATIVE  NEGATIVE mg/dL   Protein, ur NEGATIVE  NEGATIVE mg/dL    Urobilinogen, UA 1.0  0.0 - 1.0 mg/dL   Nitrite NEGATIVE  NEGATIVE   Leukocytes, UA TRACE (*) NEGATIVE  PREGNANCY, URINE      Result Value Range   Preg Test, Ur NEGATIVE  NEGATIVE  URINE RAPID DRUG SCREEN (HOSP PERFORMED)      Result Value Range   Opiates NONE DETECTED  NONE DETECTED   Cocaine NONE DETECTED  NONE DETECTED   Benzodiazepines NONE DETECTED  NONE DETECTED   Amphetamines NONE DETECTED  NONE DETECTED   Tetrahydrocannabinol POSITIVE (*) NONE DETECTED   Barbiturates NONE DETECTED  NONE DETECTED  URINE MICROSCOPIC-ADD ON      Result Value Range   Squamous Epithelial / LPF RARE  RARE   WBC, UA 0-2  <3 WBC/hpf   RBC / HPF 0-2  <3 RBC/hpf   Bacteria, UA RARE  RARE   Urine-Other RARE YEAST     Laboratory interpretation all normal except + UDS    MDM   1. Anxiety   2. Depression    Plan discharge   Devoria Albe, MD, Franz Dell, MD 05/13/13 [phone]

## 2013-10-02 ENCOUNTER — Emergency Department (HOSPITAL_COMMUNITY): Payer: BC Managed Care – PPO

## 2013-10-02 ENCOUNTER — Emergency Department (HOSPITAL_COMMUNITY)
Admission: EM | Admit: 2013-10-02 | Discharge: 2013-10-02 | Discharge status: Against Medical Advice | Payer: BC Managed Care – PPO | Attending: Emergency Medicine | Admitting: Emergency Medicine

## 2013-10-02 ENCOUNTER — Emergency Department (HOSPITAL_COMMUNITY)
Admission: EM | Admit: 2013-10-02 | Discharge: 2013-10-02 | Discharge status: Against Medical Advice | Payer: BC Managed Care – PPO

## 2013-10-02 ENCOUNTER — Encounter (HOSPITAL_COMMUNITY): Payer: Self-pay | Admitting: Emergency Medicine

## 2013-10-02 DIAGNOSIS — F17200 Nicotine dependence, unspecified, uncomplicated: Secondary | ICD-10-CM | POA: Insufficient documentation

## 2013-10-02 DIAGNOSIS — R079 Chest pain, unspecified: Secondary | ICD-10-CM | POA: Insufficient documentation

## 2013-10-02 NOTE — ED Notes (Signed)
Pt here earlier today and left.  Pt returning for same.

## 2013-10-02 NOTE — ED Notes (Signed)
Pt decided to leave once again.
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
## 2014-03-26 ENCOUNTER — Encounter (HOSPITAL_BASED_OUTPATIENT_CLINIC_OR_DEPARTMENT_OTHER): Payer: Self-pay | Admitting: Emergency Medicine

## 2014-03-26 ENCOUNTER — Emergency Department (HOSPITAL_BASED_OUTPATIENT_CLINIC_OR_DEPARTMENT_OTHER)
Admission: EM | Admit: 2014-03-26 | Discharge: 2014-03-26 | Disposition: A | Discharge status: Home / Self Care | Payer: BC Managed Care – PPO | Attending: Emergency Medicine | Admitting: Emergency Medicine

## 2014-03-26 DIAGNOSIS — Z760 Encounter for issue of repeat prescription: Secondary | ICD-10-CM | POA: Insufficient documentation

## 2014-03-26 DIAGNOSIS — Z7982 Long term (current) use of aspirin: Secondary | ICD-10-CM | POA: Insufficient documentation

## 2014-03-26 DIAGNOSIS — A6000 Herpesviral infection of urogenital system, unspecified: Secondary | ICD-10-CM | POA: Insufficient documentation

## 2014-03-26 DIAGNOSIS — F17200 Nicotine dependence, unspecified, uncomplicated: Secondary | ICD-10-CM | POA: Insufficient documentation

## 2014-03-26 DIAGNOSIS — Z79899 Other long term (current) drug therapy: Secondary | ICD-10-CM | POA: Insufficient documentation

## 2014-03-26 MED ORDER — ACYCLOVIR 400 MG PO TABS: 400.0000 mg | ORAL_TABLET | Freq: Three times a day (TID) | ORAL | Status: AC

## 2014-03-26 MED ORDER — TRAMADOL HCL 50 MG PO TABS: 50.0000 mg | ORAL_TABLET | Freq: Four times a day (QID) | ORAL | Status: DC | PRN

## 2014-03-26 NOTE — Discharge Instructions (Signed)
If you were given medicines take as directed.  If you are on coumadin or contraceptives realize their levels and effectiveness is altered by many different medicines.  If you have any reaction (rash, tongues swelling, other) to the medicines stop taking and see a physician.   Please follow up as directed and return to the ER or see a physician for new or worsening symptoms.  Thank you. Filed Vitals:   03/26/14 1103  BP: 149/91  Pulse: 97  Temp: 98.2 F (36.8 C)  TempSrc: Oral  Resp: 18  Height: 5\' 4"  (1.626 m)  Weight: 140 lb (63.504 kg)  SpO2: 100%    Genital Herpes Genital herpes is a sexually transmitted disease. This means that it is a disease passed by having sex with an infected person. There is no cure for genital herpes. The time between attacks can be months to years. The virus may live in a person but produce no problems (symptoms). This infection can be passed to a baby as it travels down the birth canal (vagina). In a newborn, this can cause central nervous system damage, eye damage, or even death. The virus that causes genital herpes is usually HSV-2 virus. The virus that causes oral herpes is usually HSV-1. The diagnosis (learning what is wrong) is made through culture results. SYMPTOMS  Usually symptoms of pain and itching begin a few days to a week after contact. It first appears as small blisters that progress to small painful ulcers which then scab over and heal after several days. It affects the outer genitalia, birth canal, cervix, penis, anal area, buttocks, and thighs. HOME CARE INSTRUCTIONS   Keep ulcerated areas dry and clean.  Take medications as directed. Antiviral medications can speed up healing. They will not prevent recurrences or cure this infection. These medications can also be taken for suppression if there are frequent recurrences.  While the infection is active, it is contagious. Avoid all sexual contact during active infections.  Condoms may help  prevent spread of the herpes virus.  Practice safe sex.  Wash your hands thoroughly after touching the genital area.  Avoid touching your eyes after touching your genital area.  Inform your caregiver if you have had genital herpes and become pregnant. It is your responsibility to insure a safe outcome for your baby in this pregnancy.  Only take over-the-counter or prescription medicines for pain, discomfort, or fever as directed by your caregiver. SEEK MEDICAL CARE IF:   You have a recurrence of this infection.  You do not respond to medications and are not improving.  You have new sources of pain or discharge which have changed from the original infection.  You have an oral temperature above 102 F (38.9 C).  You develop abdominal pain.  You develop eye pain or signs of eye infection. Document Released: 08/03/2000 Document Revised: 10/29/2011 Document Reviewed: 08/24/2009 [facility] Patient Information 2015 [place], [place]. This information is not intended to replace advice given to you by your health care provider. Make sure you discuss any questions you have with your health care provider.

## 2014-03-26 NOTE — ED Provider Notes (Signed)
CSN: [id number]     Arrival date & time 03/26/14  1055 History   First MD Initiated Contact with Patient 03/26/14 1105     Chief Complaint  Patient presents with  . Medication Refill     (Consider location/radiation/quality/duration/timing/severity/associated sxs/prior Treatment) HPI Comments: 38 year old female with genital and oral herpes history, smoker presents with herpes flare started yesterday. Patient is been out of her medicines and recently moved here and no primary Dr. Pain with palpation and when urine touches the lesions. Similar to previous cover she has not had a recent outbreak. Patient significant other does not have herpes outbreak at this time. Patient has no new sexual contacts. No fevers or chills or abdominal pain.  The history is provided by the patient.    Past Medical History  Diagnosis Date  . No pertinent past medical history   . Herpes    Past Surgical History  Procedure Laterality Date  . Appendectomy    . Dilation and curettage of uterus     No family history on file. History  Substance Use Topics  . Smoking status: Current Every Day Smoker -- 1.00 packs/day    Types: Cigarettes  . Smokeless tobacco: Not on file  . Alcohol Use: No   OB History   Grav Para Term Preterm Abortions TAB SAB Ect Mult Living   6 2   4 3  1  2      Review of Systems  Constitutional: Negative for fever and chills.  HENT: Negative for congestion.   Eyes: Negative for visual disturbance.  Respiratory: Negative for shortness of breath.   Cardiovascular: Negative for chest pain.  Gastrointestinal: Negative for vomiting and abdominal pain.  Genitourinary: Positive for dysuria and vaginal pain. Negative for flank pain, vaginal bleeding and vaginal discharge.  Musculoskeletal: Negative for back pain, neck pain and neck stiffness.  Skin: Negative for rash.  Neurological: Negative for light-headedness and headaches.      Allergies  Review of patient's allergies  indicates no known allergies.  Home Medications   Prior to Admission medications   Medication Sig Start Date End Date Taking? Authorizing Provider  acyclovir (ZOVIRAX) 400 MG tablet Take 1 tablet (400 mg total) by mouth 3 (three) times daily. 03/26/14 03/30/14  Enid Skeens, MD  aspirin 325 MG tablet Take 650 mg by mouth daily.    Historical Provider, MD  traMADol (ULTRAM) 50 MG tablet Take 1 tablet (50 mg total) by mouth every 6 (six) hours as needed. 03/26/14   Enid Skeens, MD  valACYclovir (VALTREX) 1000 MG tablet Take 1,000 mg by mouth daily as needed (outbreaks).     Historical Provider, MD   BP 149/91  Pulse 97  Temp(Src) 98.2 F (36.8 C) (Oral)  Resp 18  Ht 5\' 4"  (1.626 m)  Wt 140 lb (63.504 kg)  BMI 24.02 kg/m2  SpO2 100% Physical Exam  Nursing note and vitals reviewed. Constitutional: She is oriented to person, place, and time. She appears well-developed and well-nourished.  HENT:  Head: Normocephalic and atraumatic.  Eyes: Conjunctivae are normal. Right eye exhibits no discharge. Left eye exhibits no discharge.  Neck: Normal range of motion. Neck supple. No tracheal deviation present.  Cardiovascular: Normal rate.   Pulmonary/Chest: Effort normal.  Abdominal: Soft. She exhibits no distension. There is no tenderness. There is no guarding.  Genitourinary:  Patient has superficial genital ulcers approximately 0.5 cm diameter worse in the right labia majora. No active discharge. Tender to palpation.  Musculoskeletal: She  exhibits no edema.  Neurological: She is alert and oriented to person, place, and time.  Skin: Skin is warm. No rash noted.  Psychiatric: She has a normal mood and affect.    ED Course  Procedures (including critical care time) Labs Review Labs Reviewed - No data to display  Imaging Review No results found.   EKG Interpretation None      MDM   Final diagnoses:  Genital herpes   Clinically genital herpes outbreak. Patient well-appearing  otherwise has had this in the past. Discussed acyclovir as patient cannot afford valacyclovir this time. Followup with OB/GYN discussed.  Results and differential diagnosis were discussed with the patient/parent/guardian. Close follow up outpatient was discussed, comfortable with the plan.   Medications - No data to display  Filed Vitals:   03/26/14 1103  BP: 149/91  Pulse: 97  Temp: 98.2 F (36.8 C)  TempSrc: Oral  Resp: 18  Height: 5\' 4"  (1.626 m)  Weight: 140 lb (63.504 kg)  SpO2: 100%         Enid SkeensJoshua M Jossalyn Forgione, MD 03/26/14 1140

## 2014-03-26 NOTE — ED Notes (Signed)
Pt reports that she has herpes, sts that she is out of the medication. Pt hasn't been taking the medication recently. Pt now has outbreak.

## 2014-06-21 ENCOUNTER — Encounter (HOSPITAL_BASED_OUTPATIENT_CLINIC_OR_DEPARTMENT_OTHER): Payer: Self-pay | Admitting: Emergency Medicine

## 2015-07-20 ENCOUNTER — Emergency Department (HOSPITAL_COMMUNITY): Payer: BLUE CROSS/BLUE SHIELD

## 2015-07-20 ENCOUNTER — Encounter (HOSPITAL_COMMUNITY): Payer: Self-pay | Admitting: Neurology

## 2015-07-20 ENCOUNTER — Inpatient Hospital Stay (HOSPITAL_COMMUNITY)
Admission: EM | Admit: 2015-07-20 | Discharge: 2015-07-25 | DRG: 059 | Disposition: A | Discharge status: Home / Self Care | Payer: BLUE CROSS/BLUE SHIELD | Attending: Internal Medicine | Admitting: Internal Medicine

## 2015-07-20 ENCOUNTER — Ambulatory Visit (INDEPENDENT_AMBULATORY_CARE_PROVIDER_SITE_OTHER): Payer: BLUE CROSS/BLUE SHIELD | Admitting: Emergency Medicine

## 2015-07-20 VITALS — BP 114/84 | HR 70 | Temp 98.7°F | Resp 16 | Ht 65.0 in | Wt 127.8 lb

## 2015-07-20 DIAGNOSIS — Z23 Encounter for immunization: Secondary | ICD-10-CM

## 2015-07-20 DIAGNOSIS — A6000 Herpesviral infection of urogenital system, unspecified: Secondary | ICD-10-CM | POA: Diagnosis present

## 2015-07-20 DIAGNOSIS — A86 Unspecified viral encephalitis: Secondary | ICD-10-CM | POA: Diagnosis present

## 2015-07-20 DIAGNOSIS — Z7982 Long term (current) use of aspirin: Secondary | ICD-10-CM

## 2015-07-20 DIAGNOSIS — G35 Multiple sclerosis: Secondary | ICD-10-CM

## 2015-07-20 DIAGNOSIS — Z87891 Personal history of nicotine dependence: Secondary | ICD-10-CM | POA: Diagnosis not present

## 2015-07-20 DIAGNOSIS — G44209 Tension-type headache, unspecified, not intractable: Secondary | ICD-10-CM

## 2015-07-20 DIAGNOSIS — H538 Other visual disturbances: Secondary | ICD-10-CM

## 2015-07-20 DIAGNOSIS — H532 Diplopia: Secondary | ICD-10-CM | POA: Diagnosis present

## 2015-07-20 DIAGNOSIS — R839 Unspecified abnormal finding in cerebrospinal fluid: Secondary | ICD-10-CM | POA: Insufficient documentation

## 2015-07-20 DIAGNOSIS — R9089 Other abnormal findings on diagnostic imaging of central nervous system: Secondary | ICD-10-CM | POA: Insufficient documentation

## 2015-07-20 DIAGNOSIS — G379 Demyelinating disease of central nervous system, unspecified: Principal | ICD-10-CM | POA: Diagnosis present

## 2015-07-20 DIAGNOSIS — R930 Abnormal findings on diagnostic imaging of skull and head, not elsewhere classified: Secondary | ICD-10-CM | POA: Diagnosis not present

## 2015-07-20 DIAGNOSIS — H4922 Sixth [abducent] nerve palsy, left eye: Secondary | ICD-10-CM | POA: Diagnosis present

## 2015-07-20 HISTORY — DX: Essential (primary) hypertension: I10

## 2015-07-20 HISTORY — DX: Panic disorder (episodic paroxysmal anxiety): F41.0

## 2015-07-20 HISTORY — DX: Headache, unspecified: R51.9

## 2015-07-20 HISTORY — DX: Anxiety disorder, unspecified: F41.9

## 2015-07-20 HISTORY — DX: Major depressive disorder, single episode, unspecified: F32.9

## 2015-07-20 HISTORY — DX: Headache: R51

## 2015-07-20 HISTORY — DX: Herpesviral infection of urogenital system, unspecified: A60.00

## 2015-07-20 HISTORY — DX: Gastric ulcer, unspecified as acute or chronic, without hemorrhage or perforation: K25.9

## 2015-07-20 HISTORY — DX: Depression, unspecified: F32.A

## 2015-07-20 LAB — PROTEIN, CSF: TOTAL PROTEIN, CSF: 52 mg/dL — AB (ref 15–45)

## 2015-07-20 LAB — COMPREHENSIVE METABOLIC PANEL
ALBUMIN: 4.1 g/dL (ref 3.5–5.0)
ALT: 23 U/L (ref 14–54)
AST: 24 U/L (ref 15–41)
Alkaline Phosphatase: 75 U/L (ref 38–126)
Anion gap: 10 (ref 5–15)
BUN: 7 mg/dL (ref 6–20)
CHLORIDE: 101 mmol/L (ref 101–111)
CO2: 26 mmol/L (ref 22–32)
CREATININE: 0.72 mg/dL (ref 0.44–1.00)
Calcium: 9.5 mg/dL (ref 8.9–10.3)
GFR calc Af Amer: >60 mL/min (ref >60)
GLUCOSE: 90 mg/dL (ref 65–99)
POTASSIUM: 3.5 mmol/L (ref 3.5–5.1)
SODIUM: 137 mmol/L (ref 135–145)
Total Bilirubin: 0.8 mg/dL (ref 0.3–1.2)
Total Protein: 7.5 g/dL (ref 6.5–8.1)

## 2015-07-20 LAB — I-STAT CHEM 8, ED
BUN: 8 mg/dL (ref 6–20)
CHLORIDE: 100 mmol/L — AB (ref 101–111)
CREATININE: 0.7 mg/dL (ref 0.44–1.00)
Calcium, Ion: 1.21 mmol/L (ref 1.12–1.23)
GLUCOSE: 93 mg/dL (ref 65–99)
HEMATOCRIT: 43 % (ref 36.0–46.0)
Hemoglobin: 14.6 g/dL (ref 12.0–15.0)
POTASSIUM: 3.7 mmol/L (ref 3.5–5.1)
Sodium: 139 mmol/L (ref 135–145)
TCO2: 28 mmol/L (ref 0–100)

## 2015-07-20 LAB — RAPID HIV SCREEN (HIV 1/2 AB+AG)
HIV 1/2 ANTIBODIES: NONREACTIVE
HIV-1 P24 ANTIGEN - HIV24: NONREACTIVE

## 2015-07-20 LAB — CSF CELL COUNT WITH DIFFERENTIAL
LYMPHS CSF: 50 % (ref 40–80)
Lymphs, CSF: 58 % (ref 40–80)
MONOCYTE-MACROPHAGE-SPINAL FLUID: 9 % — AB (ref 15–45)
Monocyte-Macrophage-Spinal Fluid: 7 % — ABNORMAL LOW (ref 15–45)
RBC COUNT CSF: 1 /mm3 — AB
RBC COUNT CSF: 2 /mm3 — AB
SEGMENTED NEUTROPHILS-CSF: 33 % — AB (ref 0–6)
SEGMENTED NEUTROPHILS-CSF: 43 % — AB (ref 0–6)
Tube #: 1
Tube #: 4
WBC CSF: 175 /mm3 — AB (ref 0–5)
WBC, CSF: 160 /mm3 (ref 0–5)

## 2015-07-20 LAB — GLUCOSE, CSF: Glucose, CSF: 48 mg/dL (ref 40–70)

## 2015-07-20 LAB — DIFFERENTIAL
BASOS ABS: 0 10*3/uL (ref 0.0–0.1)
BASOS PCT: 0 %
Eosinophils Absolute: 0 10*3/uL (ref 0.0–0.7)
Eosinophils Relative: 0 %
Lymphocytes Relative: 41 %
Lymphs Abs: 3.1 10*3/uL (ref 0.7–4.0)
MONOS PCT: 9 %
Monocytes Absolute: 0.7 10*3/uL (ref 0.1–1.0)
NEUTROS ABS: 3.8 10*3/uL (ref 1.7–7.7)
NEUTROS PCT: 50 %

## 2015-07-20 LAB — CBC
HEMATOCRIT: 37.6 % (ref 36.0–46.0)
Hemoglobin: 12.9 g/dL (ref 12.0–15.0)
MCH: 31.4 pg (ref 26.0–34.0)
MCHC: 34.3 g/dL (ref 30.0–36.0)
MCV: 91.5 fL (ref 78.0–100.0)
Platelets: 277 10*3/uL (ref 150–400)
RBC: 4.11 MIL/uL (ref 3.87–5.11)
RDW: 13 % (ref 11.5–15.5)
WBC: 7.6 10*3/uL (ref 4.0–10.5)

## 2015-07-20 LAB — PROTIME-INR
INR: 1.03 (ref 0.00–1.49)
Prothrombin Time: 13.7 seconds (ref 11.6–15.2)

## 2015-07-20 LAB — I-STAT TROPONIN, ED: Troponin i, poc: 0 ng/mL (ref 0.00–0.08)

## 2015-07-20 LAB — APTT: APTT: 29 s (ref 24–37)

## 2015-07-20 MED ORDER — INFLUENZA VAC SPLIT QUAD 0.5 ML IM SUSY
0.5000 mL | PREFILLED_SYRINGE | INTRAMUSCULAR | Status: AC
  Administered 2015-07-21: 0.5 mL via INTRAMUSCULAR
  Filled 2015-07-20: qty 0.5

## 2015-07-20 MED ORDER — FENTANYL CITRATE (PF) 100 MCG/2ML IJ SOLN
75.0000 ug | INTRAMUSCULAR | Status: DC | PRN
  Administered 2015-07-20: 75 ug via INTRAVENOUS
  Filled 2015-07-20: qty 2

## 2015-07-20 MED ORDER — PNEUMOCOCCAL VAC POLYVALENT 25 MCG/0.5ML IJ INJ
0.5000 mL | INJECTION | INTRAMUSCULAR | Status: AC
  Administered 2015-07-21: 0.5 mL via INTRAMUSCULAR
  Filled 2015-07-20: qty 0.5

## 2015-07-20 MED ORDER — VALACYCLOVIR HCL 500 MG PO TABS
1000.0000 mg | ORAL_TABLET | Freq: Two times a day (BID) | ORAL | Status: DC
  Filled 2015-07-20: qty 2

## 2015-07-20 MED ORDER — METHYLPREDNISOLONE SODIUM SUCC 1000 MG IJ SOLR
250.0000 mg | Freq: Four times a day (QID) | INTRAMUSCULAR | Status: DC
  Administered 2015-07-20 – 2015-07-25 (×18): 250 mg via INTRAVENOUS
  Filled 2015-07-20 (×27): qty 2

## 2015-07-20 MED ORDER — GADOBENATE DIMEGLUMINE 529 MG/ML IV SOLN
10.0000 mL | Freq: Once | INTRAVENOUS | Status: AC | PRN
  Administered 2015-07-20: 10 mL via INTRAVENOUS

## 2015-07-20 MED ORDER — HEPARIN SODIUM (PORCINE) 5000 UNIT/ML IJ SOLN
5000.0000 [IU] | Freq: Three times a day (TID) | INTRAMUSCULAR | Status: DC
  Administered 2015-07-20 – 2015-07-23 (×8): 5000 [IU] via SUBCUTANEOUS
  Filled 2015-07-20 (×7): qty 1

## 2015-07-20 MED ORDER — ENSURE ENLIVE PO LIQD
237.0000 mL | Freq: Two times a day (BID) | ORAL | Status: DC
  Administered 2015-07-21 – 2015-07-23 (×5): 237 mL via ORAL

## 2015-07-20 MED ORDER — ONDANSETRON HCL 4 MG/2ML IJ SOLN
4.0000 mg | Freq: Once | INTRAMUSCULAR | Status: AC
  Administered 2015-07-20: 4 mg via INTRAVENOUS
  Filled 2015-07-20: qty 2

## 2015-07-20 MED ORDER — DEXTROSE 5 % IV SOLN
10.0000 mg/kg | Freq: Three times a day (TID) | INTRAVENOUS | Status: DC
  Administered 2015-07-20 – 2015-07-23 (×8): 570 mg via INTRAVENOUS
  Filled 2015-07-20 (×11): qty 11.4

## 2015-07-20 MED ORDER — BUTALBITAL-APAP-CAFFEINE 50-325-40 MG PO TABS: 1.0000 | ORAL_TABLET | Freq: Four times a day (QID) | ORAL | Status: DC | PRN

## 2015-07-20 MED ORDER — DOCUSATE SODIUM 100 MG PO CAPS
100.0000 mg | ORAL_CAPSULE | Freq: Two times a day (BID) | ORAL | Status: DC
  Administered 2015-07-21 – 2015-07-25 (×8): 100 mg via ORAL
  Filled 2015-07-20 (×10): qty 1

## 2015-07-20 MED ORDER — BISACODYL 5 MG PO TBEC
10.0000 mg | DELAYED_RELEASE_TABLET | Freq: Once | ORAL | Status: DC
  Filled 2015-07-20 (×2): qty 2

## 2015-07-20 NOTE — ED Notes (Addendum)
Pt reports yesterday had h/a, this morning at 0630 had blurry vision, blurry vision has continued intermittently throughout the day.  Still reports she is having double vision. No weakness, no facial droop, speech is clear and concise. EOM's intact.

## 2015-07-20 NOTE — Patient Instructions (Addendum)
YOU ARE TO GO OVER TO [facility] NOW.  YOUR APPOINTMENT IS AT 1030.  YOU ARE TO ARRIVE THERE AT 1015.  Address: [address], [place], [place] [address]  Phone: [phone]   Tension Headache A tension headache is a feeling of pain, pressure, or aching that is often felt over the front and sides of the head. The pain can be dull, or it can feel tight (constricting). Tension headaches are not normally associated with nausea or vomiting, and they do not get worse with physical activity. Tension headaches can last from 30 minutes to several days. This is the most common type of headache. CAUSES The exact cause of this condition is not known. Tension headaches often begin after stress, anxiety, or depression. Other triggers may include:  Alcohol.  Too much caffeine, or caffeine withdrawal.  Respiratory infections, such as colds, flu, or sinus infections.  Dental problems or teeth clenching.  Fatigue.  Holding your head and neck in the same position for a long period of time, such as while using a computer.  Smoking. SYMPTOMS Symptoms of this condition include:  A feeling of pressure around the head.  Dull, aching head pain.  Pain felt over the front and sides of the head.  Tenderness in the muscles of the head, neck, and shoulders. DIAGNOSIS This condition may be diagnosed based on your symptoms and a physical exam. Tests may be done, such as a CT scan or an MRI of your head. These tests may be done if your symptoms are severe or unusual. TREATMENT This condition may be treated with lifestyle changes and medicines to help relieve symptoms. HOME CARE INSTRUCTIONS Managing Pain  Take over-the-counter and prescription medicines only as told by your health care provider.  Lie down in a dark, quiet room when you have a headache.  If directed, apply ice to the head and neck area:  Put ice in a plastic bag.  Place a towel between your skin and the bag.  Leave the ice on  for 20 minutes, 2-3 times per day.  Use a heating pad or a hot shower to apply heat to the head and neck area as told by your health care provider. Eating and Drinking  Eat meals on a regular schedule.  Limit alcohol use.  Decrease your caffeine intake, or stop using caffeine. General Instructions  Keep all follow-up visits as told by your health care provider. This is important.  Keep a headache journal to help find out what may trigger your headaches. For example, write down:  What you eat and drink.  How much sleep you get.  Any change to your diet or medicines.  Try massage or other relaxation techniques.  Limit stress.  Sit up straight, and avoid tensing your muscles.  Do not use tobacco products, including cigarettes, chewing tobacco, or e-cigarettes. If you need help quitting, ask your health care provider.  Exercise regularly as told by your health care provider.  Get 7-9 hours of sleep, or the amount recommended by your health care provider. SEEK MEDICAL CARE IF:  Your symptoms are not helped by medicine.  You have a headache that is different from what you normally experience.  You have nausea or you vomit.  You have a fever. SEEK IMMEDIATE MEDICAL CARE IF:  Your headache becomes severe.  You have repeated vomiting.  You have a stiff neck.  You have a loss of vision.  You have problems with speech.  You have pain  in your eye or ear.  You have muscular weakness or loss of muscle control.  You lose your balance or you have trouble walking.  You feel faint or you pass out.  You have confusion.   This information is not intended to replace advice given to you by your health care provider. Make sure you discuss any questions you have with your health care provider.   Document Released: 08/06/2005 Document Revised: 04/27/2015 Document Reviewed: 11/29/2014 Elsevier Interactive Patient Education [employer]

## 2015-07-20 NOTE — Progress Notes (Addendum)
New Admission Note:   Arrival: From ED with tech Mental Orientation: A&Ox4 Telemetry: Placed on box 6e22 Assessment:  See flowsheet Skin: Intact, bandaid on mid lower back from lumbar puncture IV: Left AC IV Pain: none  Safety Measures:  Call bell placed within reach; patient instructed on use of call bell and verbalized understanding. Bed in lowest position.  Yellow bracelet on.  Non-skid socks on.  Bed alarm on. [address] Orientation: Patient oriented to staff, room, and unit. Family: None at bedside  Orders have been reviewed and implemented. Will continue to monitor.  Rozann Lesches, RN, BSN

## 2015-07-20 NOTE — Progress Notes (Signed)
Subjective:  Patient ID: Michele Yu, female    DOB: 05-19-1976  Age: 39 y.o. MRN: [id number]  CC: Headache; Generalized Body Aches; blurried vision; and see depression screen   HPI Michele Yu presents  with headaches over the last several months. She describes them as bitemporal. They're not associated with any fever chills nasal congestion postnasal drainage sore throat cough or other acute complaints. She has no neurologic or visual symptoms associated with his headaches are intermittent and controlled with over-the-counter medication. He describes him as "migraines"  She has an acute onset of what she describes blurred vision involving the right eye. She was forced to drive here with her right eye closed to disturbed vision. She has no history of injury or foreign body sensation in her drainage.  History Michele Yu has a past medical history of No pertinent past medical history and Herpes.   She has past surgical history that includes Appendectomy and Dilation and curettage of uterus.   Her  family history is not on file.  She   reports that she has been smoking Cigarettes.  She has been smoking about 1.00 pack per day. She does not have any smokeless tobacco history on file. She reports that she does not drink alcohol or use illicit drugs.  Outpatient Prescriptions Prior to Visit  Medication Sig Dispense Refill  . valACYclovir (VALTREX) 1000 MG tablet Take 1,000 mg by mouth daily as needed (outbreaks).     Marland Kitchen aspirin 325 MG tablet Take 650 mg by mouth daily.    . traMADol (ULTRAM) 50 MG tablet Take 1 tablet (50 mg total) by mouth every 6 (six) hours as needed. (Patient not taking: Reported on 07/20/2015) 6 tablet 0   No facility-administered medications prior to visit.    Social History   Social History  . Marital Status: Married    Spouse Name: N/A  . Number of Children: N/A  . Years of Education: N/A   Social History Main Topics  . Smoking status: Current Every Day  Smoker -- 1.00 packs/day    Types: Cigarettes  . Smokeless tobacco: None  . Alcohol Use: No  . Drug Use: No  . Sexual Activity: Yes    Birth Control/ Protection: Condom, Spermicide   Other Topics Concern  . None   Social History Narrative     Review of Systems  Constitutional: Negative for fever, chills and appetite change.  HENT: Negative for congestion, ear pain, postnasal drip, sinus pressure and sore throat.   Eyes: Positive for visual disturbance (blurred vision right eye this morning.). Negative for pain and redness.  Respiratory: Negative for cough, shortness of breath and wheezing.   Cardiovascular: Negative for leg swelling.  Gastrointestinal: Negative for nausea, vomiting, abdominal pain, diarrhea, constipation and blood in stool.  Endocrine: Negative for polyuria.  Genitourinary: Negative for dysuria, urgency, frequency and flank pain.  Musculoskeletal: Negative for gait problem.  Skin: Negative for rash.  Neurological: Positive for headaches (since august.  three days weekly with headache.  bitemporal). Negative for weakness.  Psychiatric/Behavioral: Negative for confusion and decreased concentration. The patient is not nervous/anxious.     Objective:  BP 114/84 mmHg  Pulse 70  Temp(Src) 98.7 F (37.1 C) (Oral)  Resp 16  Ht 5\' 5"  (1.651 m)  Wt 127 lb 12.8 oz (57.97 kg)  BMI 21.27 kg/m2  SpO2 99%  Physical Exam  Constitutional: She is oriented to person, place, and time. She appears well-developed and well-nourished. No distress.  HENT:  Head:  Normocephalic and atraumatic.  Right Ear: External ear normal.  Left Ear: External ear normal.  Nose: Nose normal.  Eyes: Conjunctivae, EOM and lids are normal. Pupils are equal, round, and reactive to light. Lids are everted and swept, no foreign bodies found. Right eye exhibits no discharge and no exudate. No foreign body present in the right eye. Left eye exhibits no discharge and no exudate. No foreign body present  in the left eye. No scleral icterus.  Fundoscopic exam:      The right eye shows no arteriolar narrowing, no AV nicking, no exudate, no hemorrhage and no papilledema.       The left eye shows no arteriolar narrowing, no AV nicking, no exudate, no hemorrhage and no papilledema.  Neck: Normal range of motion. Neck supple. No tracheal deviation present.  Cardiovascular: Normal rate, regular rhythm and normal heart sounds.   Pulmonary/Chest: Effort normal. No respiratory distress. She has no wheezes. She has no rales.  Abdominal: She exhibits no mass. There is no tenderness. There is no rebound and no guarding.  Musculoskeletal: She exhibits no edema.  Lymphadenopathy:    She has no cervical adenopathy.  Neurological: She is alert and oriented to person, place, and time. Coordination normal.  Skin: Skin is warm and dry. No rash noted.  Psychiatric: She has a normal mood and affect. Her behavior is normal.      Assessment & Plan:   Michele Yu was seen today for headache, generalized body aches, blurried vision and see depression screen.  Diagnoses and all orders for this visit:  Blurred vision, right eye -     Ambulatory referral to Ophthalmology  Tension headache  Other orders -     butalbital-acetaminophen-caffeine (FIORICET) 50-325-40 MG tablet; Take 1-2 tablets by mouth every 6 (six) hours as needed for headache.  I am having Michele Yu start on butalbital-acetaminophen-caffeine. I am also having her maintain her valACYclovir, aspirin, and traMADol.  Meds ordered this encounter  Medications  . butalbital-acetaminophen-caffeine (FIORICET) 50-325-40 MG tablet    Sig: Take 1-2 tablets by mouth every 6 (six) hours as needed for headache.    Dispense:  40 tablet    Refill:  1    Appropriate red flag conditions were discussed with the patient as well as actions that should be taken.  Patient expressed his understanding.  Follow-up: Return if symptoms worsen or fail to  improve.  Carmelina Dane, MD

## 2015-07-20 NOTE — H&P (Signed)
[facility] History and Physical  Michele Yu:[id number] DOB: [birth year] DOA: 07/20/2015  Referring physician: EDP PCP: No primary care provider on file.   Chief Complaint: Blurred vision   HPI: Michele Yu is a 39 y.o. female who presents to ED with CC of diplopia.  Symptoms onset this AM when she woke up.  Blurred vision in constant.  Numbness to sensation in left side of body.  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  No family h/o autoimmune problems.  MRI of brain shows demyelinating lesions as described.  Review of Systems: Systems reviewed.  As above, otherwise negative  Past Medical History  Diagnosis Date  . No pertinent past medical history   . Herpes    Past Surgical History  Procedure Laterality Date  . Appendectomy    . Dilation and curettage of uterus     Social History:  reports that she has quit smoking. Her smoking use included Cigarettes. She smoked 1.00 pack per day. She does not have any smokeless tobacco history on file. She reports that she does not drink alcohol or use illicit drugs.  No Known Allergies  No family history on file.   Prior to Admission medications   Medication Sig Start Date End Date Taking? Authorizing Provider  aspirin 325 MG tablet Take 650 mg by mouth daily.   Yes Historical Provider, MD  valACYclovir (VALTREX) 1000 MG tablet Take 1,000 mg by mouth 2 (two) times daily.    Yes Historical Provider, MD  butalbital-acetaminophen-caffeine (FIORICET) 50-325-40 MG tablet Take 1-2 tablets by mouth every 6 (six) hours as needed for headache. 07/20/15 07/19/16  Carmelina Dane, MD  traMADol (ULTRAM) 50 MG tablet Take 1 tablet (50 mg total) by mouth every 6 (six) hours as needed. Patient not taking: Reported on 07/20/2015 03/26/14   Blane Ohara, MD   Physical Exam: Filed Vitals:   07/20/15 1918 07/20/15 1932  BP: 131/72 135/90  Pulse: 76 86  Temp:  98.6 F (37 C)  Resp: 16 20    BP 135/90 mmHg  Pulse 86   Temp(Src) 98.6 F (37 C) (Oral)  Resp 20  SpO2 100%  General Appearance:    Alert, oriented, no distress, appears stated age  Head:    Normocephalic, atraumatic  Eyes:    PERRL, EOMI, sclera non-icteric        Nose:   Nares without drainage or epistaxis. Mucosa, turbinates normal  Throat:   Moist mucous membranes. Oropharynx without erythema or exudate.  Neck:   Supple. No carotid bruits.  No thyromegaly.  No lymphadenopathy.   Back:     No CVA tenderness, no spinal tenderness  Lungs:     Clear to auscultation bilaterally, without wheezes, rhonchi or rales  Chest wall:    No tenderness to palpitation  Heart:    Regular rate and rhythm without murmurs, gallops, rubs  Abdomen:     Soft, non-tender, nondistended, normal bowel sounds, no organomegaly  Genitalia:    deferred  Rectal:    deferred  Extremities:   No clubbing, cyanosis or edema.  Pulses:   2+ and symmetric all extremities  Skin:   Skin color, texture, turgor normal, no rashes or lesions  Lymph nodes:   Cervical, supraclavicular, and axillary nodes normal  Neurologic:   CNII-XII intact. Normal strength, sensation and reflexes      throughout    Labs on Admission:  Basic Metabolic Panel:  Recent Labs Lab 07/20/15 1334 07/20/15 1347  NA  137 139  K 3.5 3.7  CL 101 100*  CO2 26  --   GLUCOSE 90 93  BUN 7 8  CREATININE 0.72 0.70  CALCIUM 9.5  --    Liver Function Tests:  Recent Labs Lab 07/20/15 1334  AST 24  ALT 23  ALKPHOS 75  BILITOT 0.8  PROT 7.5  ALBUMIN 4.1   No results for input(s): LIPASE, AMYLASE in the last 168 hours. No results for input(s): AMMONIA in the last 168 hours. CBC:  Recent Labs Lab 07/20/15 1334 07/20/15 1347  WBC 7.6  --   NEUTROABS 3.8  --   HGB 12.9 14.6  HCT 37.6 43.0  MCV 91.5  --   PLT 277  --    Cardiac Enzymes: No results for input(s): CKTOTAL, CKMB, CKMBINDEX, TROPONINI in the last 168 hours.  BNP (last 3 results) No results for input(s): PROBNP in the last  8760 hours. CBG: No results for input(s): GLUCAP in the last 168 hours.  Radiological Exams on Admission: Ct Head Wo Contrast  07/20/2015  CLINICAL DATA:  39 year old female with headache and blurred vision since this morning. Initial encounter. EXAM: CT HEAD WITHOUT CONTRAST TECHNIQUE: Contiguous axial images were obtained from the base of the skull through the vertex without intravenous contrast. COMPARISON:  None. FINDINGS: Isolated mild left sphenoid sinus opacification. Other Visualized paranasal sinuses and mastoids are clear. Hyperostosis of the calvarium. No osseous abnormality identified. Dysconjugate gaze, otherwise negative orbit and scalp soft tissues. Cerebral volume is normal. No midline shift, ventriculomegaly, mass effect, evidence of mass lesion, intracranial hemorrhage or evidence of cortically based acute infarction. Gray-white matter differentiation is within normal limits throughout the brain. No suspicious intracranial vascular hyperdensity. IMPRESSION: Normal noncontrast CT appearance of the brain. Electronically Signed   By: Odessa Fleming M.D.   On: 07/20/2015 14:45   Mr [name] AV Contrast  07/20/2015  CLINICAL DATA:  Diplopia, onset this morning. EXAM: MRI HEAD WITHOUT AND WITH CONTRAST TECHNIQUE: Multiplanar, multiecho pulse sequences of the brain and surrounding structures were obtained without and with intravenous contrast. CONTRAST:  10mL MULTIHANCE GADOBENATE DIMEGLUMINE 529 MG/ML IV SOLN COMPARISON:  None. FINDINGS: Calvarium and upper cervical spine: No focal marrow signal abnormality. Orbits: Negative. No enhancement or asymmetric thickening of the optic nerves by brain MRI. Sinuses and Mastoids: Clear. Brain: There is patchy FLAIR hyperintensity within the midbrain, right more than left, around the lower third ventricle, and in the left more than right thalamus. Confluent hazy FLAIR hyperintensity also in deep white matter tracts. These areas show patchy predominately  parenchymal enhancement. No predominant leptomeningeal enhancement suggestive of a basilar meningitis. No hydrocephalus or acute infarct. Major intracranial vessels are patent. These results were called by telephone at the time of interpretation on 07/20/2015 at 4:44 pm to Dr. Roxy Horseman , who verbally acknowledged these results. IMPRESSION: Signal abnormality and enhancement in the midbrain, thalami, and deep white matter tracts. A demyelinating process is favored. Distribution is atypical for multiple sclerosis and atypical infection, Bechet's, and sarcoidosis/vasculitis should be considered. Recommend correlation with LP. Electronically Signed   By: Marnee Spring M.D.   On: 07/20/2015 16:49    EKG: Independently reviewed.  Assessment/Plan Active Problems:   Demyelinating disease of central nervous system, unspecified (HCC)   1. Demyelinating disease of CNS - 1. Neurology has seen patient, see their note, they are following and request admission 2. Currently on solumedrol  IV q6h empirically for possible MS 3. LP performed and results pending  4. DDx includes: MS with atypical distribution, Bechet's, neuro sarcoid, neuro vasculitis, atypical infection 5. From my perspective: will add HIV to her labs given known h/o genital HSV, as this test would broaden DDx potentially if positive (ie PML though the distribution is atypical for this).    Code Status: Full Code Family Communication: No family in room Disposition Plan: Admit to inpatient   Time spent: 70 min  Odessie Polzin M. [facility] Pager [phone]  If 7AM-7PM, please contact the day team taking care of the patient [email or web address] Password [facility] 07/20/2015, 7:39 PM

## 2015-07-20 NOTE — ED Notes (Signed)
Dr. Fayrene Fearing at bedside explaining attempting LP again.  Pt did not tolerate first LP.  Pt agrees for EDP to do procedure again.

## 2015-07-20 NOTE — ED Notes (Signed)
Critical WBC count of 160 received from lab. EDP aware.

## 2015-07-20 NOTE — Progress Notes (Signed)
ANTIBIOTIC CONSULT NOTE - INITIAL  Pharmacy Consult for Acyclovir Indication: viral meningitis  No Known Allergies  Patient Measurements:  wt: 58 kg  Vital Signs: Temp: 98.6 F (37 C) (11/30 1932) Temp Source: Oral (11/30 1932) BP: 135/72 mmHg (11/30 2051) Pulse Rate: 59 (11/30 2051)  Labs:  Recent Labs  07/20/15 1334 07/20/15 1347  WBC 7.6  --   HGB 12.9 14.6  PLT 277  --   CREATININE 0.72 0.70    Microbiology: Cx data:  11/30 CSF: px  CSF   Anti- infective's  11/30 Acyclovir >>   Assessment: 89 yoF admitted with acute onset of binocular horizontal diplopia. MRI revealed  Multiple enhancing lesions suspicious for multiple sclerosis per neuro. For now starting Acyclovir for possible CNS HSV.  Goal of Therapy:  treatment of infection  Plan:  1. Continue Acyclovir as already ordered (570 mg Q8H) 2. Following daily  Pollyann Samples, PharmD, BCPS 07/20/2015, 9:17 PM Pager: [phone]

## 2015-07-20 NOTE — Progress Notes (Signed)
LP results noted, looks a lot like viral meningitis would be expected to look: spoke with Dr. Leroy Kennedy, given h/o genital HSV, will hold valtrex and cover patient with CNS dosing of Acyclovir just in case this is CNS HSV.  Arboviruses considered very unlikely given time of year (Dec tomorrow, already had frost a couple of weeks ago, etc).

## 2015-07-20 NOTE — ED Notes (Addendum)
Report received from [place], [place].  Pt in MRI. Board updated in room.

## 2015-07-20 NOTE — Consult Note (Signed)
NEURO HOSPITALIST CONSULT NOTE   Requestig physician: Roxy Horseman, physician assistant in ER    Reason for Consult: Acute onset diplopia  HPI:                                                                                                                                          Michele Yu is an 39 y.o. female patient with no symptom medical history except for chronic genital herpes, on a daily valacyclovir. She had some moderate headache last night. This morning she woke up with horizontal binocular double vision , worse to the left gaze although the diplopia is present in either gaze. Denies blurred vision through either eye. Otherwise, denies any other new neurological symptoms, no speech problems or any numbness or weakness in arms or legs or face. She does notice some mild balance problems today. No bowel or bladder problems. 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 No family history of any autoimmune problems.  Denies any prior CNS infections or head trauma or strokes or seizures.   Past Medical History  Diagnosis Date  . No pertinent past medical history   . Herpes     Past Surgical History  Procedure Laterality Date  . Appendectomy    . Dilation and curettage of uterus      No family history on file. Family History: Denies any family history of neurological problems    Social History:  reports that she has quit smoking. Her smoking use included Cigarettes. She smoked 1.00 pack per day. She does not have any smokeless tobacco history on file. She reports that she does not drink alcohol or use illicit drugs.  No Known Allergies  MEDICATIONS:                                                                                                                      Current facility-administered medications:  .  fentaNYL (SUBLIMAZE) injection 75 mcg, 75 mcg, Intravenous, Q30 min PRN, Rolland Porter, MD, 75 mcg at 07/20/15 1827 .   methylPREDNISolone sodium succinate (SOLU-MEDROL) 250 mg in sodium chloride 0.9 % 50 mL IVPB, 250 mg, Intravenous, Q6H, Roxy Horseman, PA-C  Current outpatient prescriptions:  .  aspirin 325 MG tablet, Take 650 mg by mouth daily., Disp: ,  Rfl:  .  valACYclovir (VALTREX) 1000 MG tablet, Take 1,000 mg by mouth 2 (two) times daily. , Disp: , Rfl:  .  butalbital-acetaminophen-caffeine (FIORICET) 50-325-40 MG tablet, Take 1-2 tablets by mouth every 6 (six) hours as needed for headache., Disp: 40 tablet, Rfl: 1 .  traMADol (ULTRAM) 50 MG tablet, Take 1 tablet (50 mg total) by mouth every 6 (six) hours as needed. (Patient not taking: Reported on 07/20/2015), Disp: 6 tablet, Rfl: 0    ROS:                                                                                                                                       History obtained from the patient  General ROS: negative for - chills, fatigue, fever, night sweats, weight gain or weight loss Psychological ROS: negative for - behavioral disorder, hallucinations, memory difficulties, mood swings or suicidal ideation Ophthalmic ROS: negative for - blurry vision, double vision, eye pain or loss of vision ENT ROS: negative for - epistaxis, nasal discharge, oral lesions, sore throat, tinnitus or vertigo Allergy and Immunology ROS: negative for - hives or itchy/watery eyes Hematological and Lymphatic ROS: negative for - bleeding problems, bruising or swollen lymph nodes Endocrine ROS: negative for - galactorrhea, hair pattern changes, polydipsia/polyuria or temperature intolerance Respiratory ROS: negative for - cough, hemoptysis, shortness of breath or wheezing Cardiovascular ROS: negative for - chest pain, dyspnea on exertion, edema or irregular heartbeat Gastrointestinal ROS: negative for - abdominal pain, diarrhea, hematemesis, nausea/vomiting or stool incontinence Genito-Urinary ROS: negative for - dysuria, hematuria, incontinence or urinary  frequency/urgency Musculoskeletal ROS: negative for - joint swelling or muscular weakness Neurological ROS: as noted in HPI Dermatological ROS: negative for rash and skin lesion changes   Blood pressure 148/109, pulse 76, temperature 98 F (36.7 C), temperature source Oral, resp. rate 15, SpO2 100 %.   Neurologic Examination:                                                                                                      HEENT-  Normocephalic, no lesions, without obvious abnormality.  Normal external eye and conjunctiva.  Normal auditory canals and external ears.   Neurological Examination Mental Status: Alert, oriented, thought content appropriate.  Speech fluent without evidence of aphasia.  Able to follow 3 step commands without difficulty. Cranial Nerves: II: Visual fields grossly normal, pupils equal, round, reactive to light and accommodation III,IV, VI: ptosis not present,  a mild left eye lateral rectus palsy  is noted with some minimal disconjugate gaze .  V,VII: smile symmetric, facial light touch sensation normal bilaterally VIII: hearing normal bilaterally IX,X: uvula rises symmetrically XI: bilateral shoulder shrug XII: midline tongue extension Motor: Right : Upper extremity   5/5    Left:     Upper extremity   5/5  Lower extremity   5/5     Lower extremity   5/5 Tone and bulk:normal tone throughout; no atrophy noted Sensory: Pinprick and light touch intact throughout, bilaterally Deep Tendon Reflexes: 2+ and symmetric throughout Plantars: Right: downgoing   Left: downgoing Cerebellar: normal finger-to-nose, Gait:Mildly broad-based gait, had significant trouble with tandem walk which patient felt that it was unusual for her.     Lab Results: Basic Metabolic Panel:  Recent Labs Lab 07/20/15 1334 07/20/15 1347  NA 137 139  K 3.5 3.7  CL 101 100*  CO2 26  --   GLUCOSE 90 93  BUN 7 8  CREATININE 0.72 0.70  CALCIUM 9.5  --     Liver Function  Tests:  Recent Labs Lab 07/20/15 1334  AST 24  ALT 23  ALKPHOS 75  BILITOT 0.8  PROT 7.5  ALBUMIN 4.1   No results for input(s): LIPASE, AMYLASE in the last 168 hours. No results for input(s): AMMONIA in the last 168 hours.  CBC:  Recent Labs Lab 07/20/15 1334 07/20/15 1347  WBC 7.6  --   NEUTROABS 3.8  --   HGB 12.9 14.6  HCT 37.6 43.0  MCV 91.5  --   PLT 277  --     Cardiac Enzymes: No results for input(s): CKTOTAL, CKMB, CKMBINDEX, TROPONINI in the last 168 hours.  Lipid Panel: No results for input(s): CHOL, TRIG, HDL, CHOLHDL, VLDL, LDLCALC in the last 168 hours.  CBG: No results for input(s): GLUCAP in the last 168 hours.  Microbiology: Results for orders placed or performed during the hospital encounter of 06/11/11  Wet prep, genital     Status: Abnormal   Collection Time: 06/11/11  4:24 PM  Result Value Ref Range Status   Yeast Wet Prep HPF POC NONE SEEN NONE SEEN Final   Trich, Wet Prep NONE SEEN NONE SEEN Final   Clue Cells Wet Prep HPF POC NONE SEEN NONE SEEN Final   WBC, Wet Prep HPF POC MANY (A) NONE SEEN Final    Comment: MANY BACTERIA SEEN  Herpes simplex virus culture     Status: None   Collection Time: 06/11/11  4:24 PM  Result Value Ref Range Status   Specimen Description VAGINA  Final   Special Requests Normal  Final   Culture No Herpes Simplex Virus detected.  Final   Report Status 06/13/2011 FINAL  Final    Coagulation Studies:  Recent Labs  07/20/15 1334  LABPROT 13.7  INR 1.03    Imaging: Ct Head Wo Contrast  07/20/2015  CLINICAL DATA:  39 year old female with headache and blurred vision since this morning. Initial encounter. EXAM: CT HEAD WITHOUT CONTRAST TECHNIQUE: Contiguous axial images were obtained from the base of the skull through the vertex without intravenous contrast. COMPARISON:  None. FINDINGS: Isolated mild left sphenoid sinus opacification. Other Visualized paranasal sinuses and mastoids are clear. Hyperostosis  of the calvarium. No osseous abnormality identified. Dysconjugate gaze, otherwise negative orbit and scalp soft tissues. Cerebral volume is normal. No midline shift, ventriculomegaly, mass effect, evidence of mass lesion, intracranial hemorrhage or evidence of cortically based acute infarction. Gray-white matter differentiation is within normal limits throughout  the brain. No suspicious intracranial vascular hyperdensity. IMPRESSION: Normal noncontrast CT appearance of the brain. Electronically Signed   By: Odessa Fleming M.D.   On: 07/20/2015 14:45   Mr [name] HC Contrast  07/20/2015  CLINICAL DATA:  Diplopia, onset this morning. EXAM: MRI HEAD WITHOUT AND WITH CONTRAST TECHNIQUE: Multiplanar, multiecho pulse sequences of the brain and surrounding structures were obtained without and with intravenous contrast. CONTRAST:  46mL MULTIHANCE GADOBENATE DIMEGLUMINE 529 MG/ML IV SOLN COMPARISON:  None. FINDINGS: Calvarium and upper cervical spine: No focal marrow signal abnormality. Orbits: Negative. No enhancement or asymmetric thickening of the optic nerves by brain MRI. Sinuses and Mastoids: Clear. Brain: There is patchy FLAIR hyperintensity within the midbrain, right more than left, around the lower third ventricle, and in the left more than right thalamus. Confluent hazy FLAIR hyperintensity also in deep white matter tracts. These areas show patchy predominately parenchymal enhancement. No predominant leptomeningeal enhancement suggestive of a basilar meningitis. No hydrocephalus or acute infarct. Major intracranial vessels are patent. These results were called by telephone at the time of interpretation on 07/20/2015 at 4:44 pm to Dr. Roxy Horseman , who verbally acknowledged these results. IMPRESSION: Signal abnormality and enhancement in the midbrain, thalami, and deep white matter tracts. A demyelinating process is favored. Distribution is atypical for multiple sclerosis and atypical infection, Bechet's, and  sarcoidosis/vasculitis should be considered. Recommend correlation with LP. Electronically Signed   By: Marnee Spring M.D.   On: 07/20/2015 16:49     Assessment/Plan: 39 year old African American female patient with no significant medical history, presented with acute onset of binocular horizontal diplopia after she woke up this morning. Neurological examination is suggestive of a mild left eye lateral rectus palsy and mild gait instability especially with tandem walk. Otherwise just for the examination is nonfocal. Given her young age, the differential diagnosis would include a seen is demanding condition. Recommend obtaining brain MRI without without contrast for further evaluation. Next  MRI study is completed while the patient was in the ER. On a personal review of the MRI, multiple enhancing lesions were seen, involving the right midbrain, bilateral thalami, and enhancement of bilateral rectus gyri are also noted. This MRI appearance is highly suspicious for multiple sclerosis with acute enhancing lesions.  I recommend starting IV Solu-Medrol 250 mg every 6 hours. For further neurodiagnostic workup, adequate lumbar puncture with CSF studies including oligoclonal bands, IgG index, myelin basic protein in addition to the basic CSF labs. We'll also check HSV PCR in the CSF given the history of chronic genital herpes.  Patient will be admitted to the hospitalist service. Recommend physical therapy evaluation tomorrow for gait. Neurology service will continue to follow up during her hospitalization. Please call for any further questions.

## 2015-07-20 NOTE — ED Notes (Signed)
Dr. Fayrene Fearing at bedside for LP

## 2015-07-20 NOTE — ED Provider Notes (Signed)
CSN: [id number]     Arrival date & time 07/20/15  1301 History   First MD Initiated Contact with Patient 07/20/15 1345     Chief Complaint  Patient presents with  . Blurred Vision     (Consider location/radiation/quality/duration/timing/severity/associated sxs/prior Treatment) HPI Comments: Patient presents to the ED with a chief complaint of diplopia. She states that she noticed the symptoms this morning when she awoke. She reports associated blurred vision that is constant. Additionally, she reports numbness sensation on her left side. She states that she had a headache last night, but does not have one now. She was seen by her ophthalmologist this morning, and was sent to the emergency department for stroke workup. She denies any other associated symptoms. Denies any weakness, slurred speech, or facial droop.  The history is provided by the patient. No language interpreter was used.    Past Medical History  Diagnosis Date  . No pertinent past medical history   . Herpes    Past Surgical History  Procedure Laterality Date  . Appendectomy    . Dilation and curettage of uterus     No family history on file. Social History  Substance Use Topics  . Smoking status: Former Smoker -- 1.00 packs/day    Types: Cigarettes  . Smokeless tobacco: None  . Alcohol Use: No   OB History    Gravida Para Term Preterm AB TAB SAB Ectopic Multiple Living   Review of Systems  Constitutional: Negative for fever and chills.  Eyes: Positive for visual disturbance.  Respiratory: Negative for shortness of breath.   Cardiovascular: Negative for chest pain.  Gastrointestinal: Negative for nausea, vomiting, diarrhea and constipation.  Genitourinary: Negative for dysuria.  Neurological: Positive for numbness.  All other systems reviewed and are negative.     Allergies  Review of patient's allergies indicates no known allergies.  Home Medications   Prior to Admission  medications   Medication Sig Start Date End Date Taking? Authorizing Provider  aspirin 325 MG tablet Take 650 mg by mouth daily.    Historical Provider, MD  butalbital-acetaminophen-caffeine (FIORICET) 50-325-40 MG tablet Take 1-2 tablets by mouth every 6 (six) hours as needed for headache. 07/20/15 07/19/16  Carmelina Dane, MD  traMADol (ULTRAM) 50 MG tablet Take 1 tablet (50 mg total) by mouth every 6 (six) hours as needed. Patient not taking: Reported on 07/20/2015 03/26/14   Blane Ohara, MD  valACYclovir (VALTREX) 1000 MG tablet Take 1,000 mg by mouth daily as needed (outbreaks).     Historical Provider, MD   BP 124/81 mmHg  Pulse 70  Temp(Src) 97.6 F (36.4 C) (Oral)  Resp 16  SpO2 99% Physical Exam  Constitutional: She is oriented to person, place, and time. She appears well-developed and well-nourished.  HENT:  Head: Normocephalic and atraumatic.  No facial droop  Eyes: Conjunctivae and EOM are normal. Pupils are equal, round, and reactive to light.  Asymmetric tracking thighs, consistent with partial bilateral CN 6 palsy Eyes are dilated from prior exam today  Neck: Normal range of motion. Neck supple.  Cardiovascular: Normal rate and regular rhythm.  Exam reveals no gallop and no friction rub.   No murmur heard. Pulmonary/Chest: Effort normal and breath sounds normal. No respiratory distress. She has no wheezes. She has no rales. She exhibits no tenderness.  Abdominal: Soft. Bowel sounds are normal. She exhibits no distension and no mass. There is  no tenderness. There is no rebound and no guarding.  Musculoskeletal: Normal range of motion. She exhibits no edema or tenderness.  Neurological: She is alert and oriented to person, place, and time.  Sensation and strength intact See eyes for CN6, otherwise CN3-12 intact Speech is clear Movements are goal oriented No pronator drift Normal finger to nose Normal heel to shin Normal peripheral visual fields  Skin: Skin is  warm and dry.  Psychiatric: She has a normal mood and affect. Her behavior is normal. Judgment and thought content normal.  Nursing note and vitals reviewed.   ED Course  Procedures (including critical care time) Results for orders placed or performed during the hospital encounter of 07/20/15  Protime-INR  Result Value Ref Range   Prothrombin Time 13.7 11.6 - 15.2 seconds   INR 1.03 0.00 - 1.49  APTT  Result Value Ref Range   aPTT 29 24 - 37 seconds  CBC  Result Value Ref Range   WBC 7.6 4.0 - 10.5 K/uL   RBC 4.11 3.87 - 5.11 MIL/uL   Hemoglobin 12.9 12.0 - 15.0 g/dL   HCT 65.7 84.6 - 96.2 %   MCV 91.5 78.0 - 100.0 fL   MCH 31.4 26.0 - 34.0 pg   MCHC 34.3 30.0 - 36.0 g/dL   RDW 95.2 84.1 - 32.4 %   Platelets 277 150 - 400 K/uL  Differential  Result Value Ref Range   Neutrophils Relative % 50 %   Neutro Abs 3.8 1.7 - 7.7 K/uL   Lymphocytes Relative 41 %   Lymphs Abs 3.1 0.7 - 4.0 K/uL   Monocytes Relative 9 %   Monocytes Absolute 0.7 0.1 - 1.0 K/uL   Eosinophils Relative 0 %   Eosinophils Absolute 0.0 0.0 - 0.7 K/uL   Basophils Relative 0 %   Basophils Absolute 0.0 0.0 - 0.1 K/uL  Comprehensive metabolic panel  Result Value Ref Range   Sodium 137 135 - 145 mmol/L   Potassium 3.5 3.5 - 5.1 mmol/L   Chloride 101 101 - 111 mmol/L   CO2 26 22 - 32 mmol/L   Glucose, Bld 90 65 - 99 mg/dL   BUN 7 6 - 20 mg/dL   Creatinine, Ser 4.01 0.44 - 1.00 mg/dL   Calcium 9.5 8.9 - 02.7 mg/dL   Total Protein 7.5 6.5 - 8.1 g/dL   Albumin 4.1 3.5 - 5.0 g/dL   AST 24 15 - 41 U/L   ALT 23 14 - 54 U/L   Alkaline Phosphatase 75 38 - 126 U/L   Total Bilirubin 0.8 0.3 - 1.2 mg/dL   GFR calc non Af Amer >60 >60 mL/min   GFR calc Af Amer >60 >60 mL/min   Anion gap 10 5 - 15  I-stat troponin, ED (not at [facility], [facility])  Result Value Ref Range   Troponin i, poc 0.00 0.00 - 0.08 ng/mL   Comment 3          I-Stat Chem 8, ED  (not at [facility], [facility])  Result Value Ref Range   Sodium 139 135 - 145  mmol/L   Potassium 3.7 3.5 - 5.1 mmol/L   Chloride 100 (L) 101 - 111 mmol/L   BUN 8 6 - 20 mg/dL   Creatinine, Ser 2.53 0.44 - 1.00 mg/dL   Glucose, Bld 93 65 - 99 mg/dL   Calcium, Ion 6.64 4.03 - 1.23 mmol/L   TCO2 28 0 - 100 mmol/L   Hemoglobin 14.6 12.0 - 15.0  g/dL   HCT 91.9 16.6 - 06.0 %   Ct Head Wo Contrast  07/20/2015  CLINICAL DATA:  39 year old female with headache and blurred vision since this morning. Initial encounter. EXAM: CT HEAD WITHOUT CONTRAST TECHNIQUE: Contiguous axial images were obtained from the base of the skull through the vertex without intravenous contrast. COMPARISON:  None. FINDINGS: Isolated mild left sphenoid sinus opacification. Other Visualized paranasal sinuses and mastoids are clear. Hyperostosis of the calvarium. No osseous abnormality identified. Dysconjugate gaze, otherwise negative orbit and scalp soft tissues. Cerebral volume is normal. No midline shift, ventriculomegaly, mass effect, evidence of mass lesion, intracranial hemorrhage or evidence of cortically based acute infarction. Gray-white matter differentiation is within normal limits throughout the brain. No suspicious intracranial vascular hyperdensity. IMPRESSION: Normal noncontrast CT appearance of the brain. Electronically Signed   By: Odessa Fleming M.D.   On: 07/20/2015 14:45   Mr [name] OK Contrast  07/20/2015  CLINICAL DATA:  Diplopia, onset this morning. EXAM: MRI HEAD WITHOUT AND WITH CONTRAST TECHNIQUE: Multiplanar, multiecho pulse sequences of the brain and surrounding structures were obtained without and with intravenous contrast. CONTRAST:  20mL MULTIHANCE GADOBENATE DIMEGLUMINE 529 MG/ML IV SOLN COMPARISON:  None. FINDINGS: Calvarium and upper cervical spine: No focal marrow signal abnormality. Orbits: Negative. No enhancement or asymmetric thickening of the optic nerves by brain MRI. Sinuses and Mastoids: Clear. Brain: There is patchy FLAIR hyperintensity within the midbrain, right more than  left, around the lower third ventricle, and in the left more than right thalamus. Confluent hazy FLAIR hyperintensity also in deep white matter tracts. These areas show patchy predominately parenchymal enhancement. No predominant leptomeningeal enhancement suggestive of a basilar meningitis. No hydrocephalus or acute infarct. Major intracranial vessels are patent. These results were called by telephone at the time of interpretation on 07/20/2015 at 4:44 pm to Dr. Roxy Horseman , who verbally acknowledged these results. IMPRESSION: Signal abnormality and enhancement in the midbrain, thalami, and deep white matter tracts. A demyelinating process is favored. Distribution is atypical for multiple sclerosis and atypical infection, Bechet's, and sarcoidosis/vasculitis should be considered. Recommend correlation with LP. Electronically Signed   By: Marnee Spring M.D.   On: 07/20/2015 16:49    I have personally reviewed and evaluated these images and lab results as part of my medical decision-making.   MDM   Final diagnoses:  Demyelinating disease of central nervous system, unspecified (HCC)   Patient with diploplia.  Sent to ED by ophthalmologist to rule out stroke.  2:11 PM Patient discussed with Dr. Lavon Paganini, who recommends MRI with and without.  Dr. Lavon Paganini will consult on the patient as well.  MRI concerning for demyelinating disease. Patient discussed again with Dr. Lavon Paganini, who recommends LP, starting Solu-Medrol 250 mg every 6 hours. Patient to be admitted by hospitalist service.  Patient did not tolerate initial LP attempt by myself. Patient was given pain medication, and LP was performed by Dr. Fayrene Fearing with success. Please see his procedure note.  Will consult hospitalist for admission.    Roxy Horseman, PA-C 07/20/15 1928  Rolland Porter, MD 07/20/15 [phone]

## 2015-07-20 NOTE — ED Notes (Signed)
LP Tray at bedside.  Pt signed consent for LP by Roxy Horseman, PA.

## 2015-07-21 ENCOUNTER — Inpatient Hospital Stay (HOSPITAL_COMMUNITY): Payer: BLUE CROSS/BLUE SHIELD

## 2015-07-21 ENCOUNTER — Encounter (HOSPITAL_COMMUNITY): Payer: Self-pay | Admitting: Radiology

## 2015-07-21 DIAGNOSIS — G379 Demyelinating disease of central nervous system, unspecified: Principal | ICD-10-CM

## 2015-07-21 DIAGNOSIS — R9089 Other abnormal findings on diagnostic imaging of central nervous system: Secondary | ICD-10-CM | POA: Insufficient documentation

## 2015-07-21 DIAGNOSIS — H532 Diplopia: Secondary | ICD-10-CM | POA: Insufficient documentation

## 2015-07-21 DIAGNOSIS — R839 Unspecified abnormal finding in cerebrospinal fluid: Secondary | ICD-10-CM | POA: Insufficient documentation

## 2015-07-21 LAB — PATHOLOGIST SMEAR REVIEW

## 2015-07-21 MED ORDER — CETYLPYRIDINIUM CHLORIDE 0.05 % MT LIQD
7.0000 mL | Freq: Two times a day (BID) | OROMUCOSAL | Status: DC
  Administered 2015-07-21 – 2015-07-24 (×8): 7 mL via OROMUCOSAL

## 2015-07-21 MED ORDER — FAMOTIDINE 20 MG PO TABS
20.0000 mg | ORAL_TABLET | Freq: Two times a day (BID) | ORAL | Status: DC
  Administered 2015-07-21 – 2015-07-25 (×8): 20 mg via ORAL
  Filled 2015-07-21 (×9): qty 1

## 2015-07-21 MED ORDER — IOHEXOL 300 MG/ML  SOLN
75.0000 mL | Freq: Once | INTRAMUSCULAR | Status: AC | PRN
  Administered 2015-07-21: 75 mL via INTRAVENOUS

## 2015-07-21 NOTE — Progress Notes (Signed)
Subjective:   39 year old female patient admitted with the diplopia with a mild left sixth nerve palsy yesterday. She started on IV Solu-Medrol to 50 mg every 6 hours. Her diplopia symptoms are completely resolved at this time. No new neurological symptoms. No side effects from Solu-Medrol.   Brain MRI showed enhancing lesions in bilateral thalami, right midbrain, with T2 hyperintensity signal changes seen in the right posterior midbrain.  CSF studies are abnormal the elevated white count of 160 , culture and Gram stain has been negative so far , protein mildly elevated.  Other CSF labs pending.   she is being covered with empiric  antibiotics and iv acyclovir.   Objective: Current vital signs: BP 128/52 mmHg  Pulse 74  Temp(Src) 98.6 F (37 C) (Oral)  Resp 19  Ht 5\' 5"  (1.651 m)  Wt 58.3 kg (128 lb 8.5 oz)  BMI 21.39 kg/m2  SpO2 100%  LMP  Vital signs in last 24 hours: Temp:  [98.5 F (36.9 C)-99 F (37.2 C)] 98.6 F (37 C) (12/01 2154) Pulse Rate:  [73-74] 74 (12/01 2154) Resp:  [18-19] 19 (12/01 2154) BP: (123-132)/(52-78) 128/52 mmHg (12/01 2154) SpO2:  [100 %] 100 % (12/01 2154) Weight:  [58.3 kg (128 lb 8.5 oz)] 58.3 kg (128 lb 8.5 oz) (12/01 2154)  Intake/Output from previous day: 11/30 0701 - 12/01 0700 In: 507.4 [P.O.:240; IV Piggyback:267.4] Out: 0  Intake/Output this shift:   Nutritional status: Diet Heart Room service appropriate?: Yes; Fluid consistency:: Thin  Neurologic Exam:  Alert, oriented 4 fluent speech , cranial nerve II 12 grossly intact. Resolved diplopia , normal extraocular movements.  Full motor strength.  Minimal gait instability noted, improved from yesterday  Lab Results: Basic Metabolic Panel:  Recent Labs Lab 07/20/15 1334 07/20/15 1347  NA 137 139  K 3.5 3.7  CL 101 100*  CO2 26  --   GLUCOSE 90 93  BUN 7 8  CREATININE 0.72 0.70  CALCIUM 9.5  --     Liver Function Tests:  Recent Labs Lab 07/20/15 1334  AST 24  ALT 23   ALKPHOS 75  BILITOT 0.8  PROT 7.5  ALBUMIN 4.1   No results for input(s): LIPASE, AMYLASE in the last 168 hours. No results for input(s): AMMONIA in the last 168 hours.  CBC:  Recent Labs Lab 07/20/15 1334 07/20/15 1347  WBC 7.6  --   NEUTROABS 3.8  --   HGB 12.9 14.6  HCT 37.6 43.0  MCV 91.5  --   PLT 277  --     Cardiac Enzymes: No results for input(s): CKTOTAL, CKMB, CKMBINDEX, TROPONINI in the last 168 hours.  Lipid Panel: No results for input(s): CHOL, TRIG, HDL, CHOLHDL, VLDL, LDLCALC in the last 168 hours.  CBG: No results for input(s): GLUCAP in the last 168 hours.  Microbiology: Results for orders placed or performed during the hospital encounter of 07/20/15  CSF culture     Status: None (Preliminary result)   Collection Time: 07/20/15  7:00 PM  Result Value Ref Range Status   Specimen Description CSF  Final   Special Requests NONE  Final   Gram Stain   Final    WBC PRESENT,BOTH PMN AND MONONUCLEAR NO ORGANISMS SEEN CYTOSPIN    Culture NO GROWTH < 24 HOURS  Final   Report Status PENDING  Incomplete    Coagulation Studies:  Recent Labs  07/20/15 1334  LABPROT 13.7  INR 1.03    Imaging: Ct Head Wo Contrast  07/20/2015  CLINICAL DATA:  39 year old female with headache and blurred vision since this morning. Initial encounter. EXAM: CT HEAD WITHOUT CONTRAST TECHNIQUE: Contiguous axial images were obtained from the base of the skull through the vertex without intravenous contrast. COMPARISON:  None. FINDINGS: Isolated mild left sphenoid sinus opacification. Other Visualized paranasal sinuses and mastoids are clear. Hyperostosis of the calvarium. No osseous abnormality identified. Dysconjugate gaze, otherwise negative orbit and scalp soft tissues. Cerebral volume is normal. No midline shift, ventriculomegaly, mass effect, evidence of mass lesion, intracranial hemorrhage or evidence of cortically based acute infarction. Gray-white matter differentiation  is within normal limits throughout the brain. No suspicious intracranial vascular hyperdensity. IMPRESSION: Normal noncontrast CT appearance of the brain. Electronically Signed   By: Odessa Fleming M.D.   On: 07/20/2015 14:45   Ct Chest W Contrast  07/21/2015  CLINICAL DATA:  39 year old female. Denies CO, SOB or cough. Ordering request evaluation for sarcoidosis. Demyelinating changes in the brain. Denies sx hx to chest/heart/lungs. EXAM: CT CHEST WITH CONTRAST TECHNIQUE: Multidetector CT imaging of the chest was performed during intravenous contrast administration. CONTRAST:  75mL OMNIPAQUE IOHEXOL 300 MG/ML  SOLN COMPARISON:  Chest x-ray 10/02/2013 FINDINGS: Heart: Heart size is normal. No imaged pericardial effusion or significant coronary artery calcifications. Vascular structures: Normal appearance of the thoracic aorta. Pulmonary arteries are normal in appearance. Mediastinum/thyroid: The visualized portion of the thyroid gland has a normal appearance. No retroperitoneal or mesenteric adenopathy. Lungs/Airways: The lungs are clear. No pulmonary nodules or pleural effusions. Airways are patent. Upper abdomen: Unremarkable. Chest wall/osseous structures: No suspicious lytic or blastic lesions are identified. Convex right scoliosis of the thoracic spine. IMPRESSION: 1. Normal appearance of the chest. No changes to indicate sarcoidosis. 2. Scoliosis. Electronically Signed   By: Norva Pavlov M.D.   On: 07/21/2015 17:01   Mr [name] ZO Contrast  07/20/2015  CLINICAL DATA:  Diplopia, onset this morning. EXAM: MRI HEAD WITHOUT AND WITH CONTRAST TECHNIQUE: Multiplanar, multiecho pulse sequences of the brain and surrounding structures were obtained without and with intravenous contrast. CONTRAST:  10mL MULTIHANCE GADOBENATE DIMEGLUMINE 529 MG/ML IV SOLN COMPARISON:  None. FINDINGS: Calvarium and upper cervical spine: No focal marrow signal abnormality. Orbits: Negative. No enhancement or asymmetric thickening of  the optic nerves by brain MRI. Sinuses and Mastoids: Clear. Brain: There is patchy FLAIR hyperintensity within the midbrain, right more than left, around the lower third ventricle, and in the left more than right thalamus. Confluent hazy FLAIR hyperintensity also in deep white matter tracts. These areas show patchy predominately parenchymal enhancement. No predominant leptomeningeal enhancement suggestive of a basilar meningitis. No hydrocephalus or acute infarct. Major intracranial vessels are patent. These results were called by telephone at the time of interpretation on 07/20/2015 at 4:44 pm to Dr. Roxy Horseman , who verbally acknowledged these results. IMPRESSION: Signal abnormality and enhancement in the midbrain, thalami, and deep white matter tracts. A demyelinating process is favored. Distribution is atypical for multiple sclerosis and atypical infection, Bechet's, and sarcoidosis/vasculitis should be considered. Recommend correlation with LP. Electronically Signed   By: Marnee Spring M.D.   On: 07/20/2015 16:49    Medications:   Current facility-administered medications:  .  acyclovir (ZOVIRAX) 570 mg in dextrose 5 % 100 mL IVPB, 10 mg/kg (Ideal), Intravenous, 3 times per day, Hillary Bow, DO, 570 mg at 07/21/15 1354 .  antiseptic oral rinse (CPC / CETYLPYRIDINIUM CHLORIDE 0.05%) solution 7 mL, 7 mL, Mouth Rinse, BID, Hillary Bow, DO,  7 mL at 07/21/15 1138 .  bisacodyl (DULCOLAX) EC tablet 10 mg, 10 mg, Oral, Once, Leanne Chang, NP, 10 mg at 07/20/15 2330 .  docusate sodium (COLACE) capsule 100 mg, 100 mg, Oral, BID, Roma Kayser Schorr, NP, 100 mg at 07/21/15 1138 .  famotidine (PEPCID) tablet 20 mg, 20 mg, Oral, BID, Ocean Kearley Daniel Nones, MD, 20 mg at 07/21/15 1724 .  feeding supplement (ENSURE ENLIVE) (ENSURE ENLIVE) liquid 237 mL, 237 mL, Oral, BID BM, Jared M Gardner, DO, 237 mL at 07/21/15 1625 .  fentaNYL (SUBLIMAZE) injection 75 mcg, 75 mcg, Intravenous, Q30 min  PRN, Rolland Porter, MD, 75 mcg at 07/20/15 1827 .  heparin injection 5,000 Units, 5,000 Units, Subcutaneous, 3 times per day, Hillary Bow, DO, 5,000 Units at 07/21/15 1402 .  methylPREDNISolone sodium succinate (SOLU-MEDROL) 250 mg in sodium chloride 0.9 % 50 mL IVPB, 250 mg, Intravenous, Q6H, Roxy Horseman, PA-C, 250 mg at 07/21/15 1724   Assessment/Plan:  39 year old African American female patient who presented with binocular horizontal diplopia yesterday with a mild left sixth nerve palsy on exam , had abnormal MRI study with enhancing lesions involving thalamus and brainstem. CSF labs were abnormal with elevated white count of 160 , with mildly elevated protein. Underlying etiology for this or an MRI and CSF abnormalities still under investigation at this time. Clinically her symptoms are completely resolved with high dose of IV Solu-Medrol to 250 mg every 6 hours  Started yesterday at admission.  Tolerating well.  No new neurological symptoms.  nonfocal exam.    Recommend MRI of the cervical and thoracic spine with and without contrast to evaluate for any demyelinating spinal cord lesions.  we will obtain CT of the chest with contrast , serum and CSF ACE levels to evaluate for sarcoidosis. -  CT chest was negative with no evidence  Suggestive of sarcoidosis.    Continue IV Solu-Medrol,  We'll plan for a total of 5 days therapy.  we will follow up .

## 2015-07-21 NOTE — Progress Notes (Signed)
[facility] PROGRESS NOTE  Michele Yu ZOX:[id number] DOB: 04/25/1976 DOA: 07/20/2015 PCP: No PCP Per Patient  Assessment/Plan: 1. Diplopia/Headache -? MS vs Viral meningitis - continue IV acyclovir, IV solumedrol - Neurology following, supportive care - HIV negative  DVT proph: Hep SQ  Code Status: Full Code Family Communication: none at bedside Disposition Plan: home when cleared by all   Consultants:  Neurology  HPI/Subjective: Feels much better, only mild photophobia  Objective: Filed Vitals:   07/20/15 2111 07/21/15 0431  BP: 125/79 123/78  Pulse: 82   Temp: 98.7 F (37.1 C) 98.5 F (36.9 C)  Resp: 17 18    Intake/Output Summary (Last 24 hours) at 07/21/15 1244 Last data filed at 07/21/15 8119  Gross per 24 hour  Intake  507.4 ml  Output      0 ml  Net  507.4 ml   Filed Weights   07/20/15 2111  Weight: 58.1 kg (128 lb 1.4 oz)    Exam:   General:  AAOx3, mild photophobia  Cardiovascular: S1S2/RRR  Respiratory: CTAB  Abdomen: soft, NT, BS present  Musculoskeletal: no edema   Neuro: non focal  Data Reviewed: Basic Metabolic Panel:  Recent Labs Lab 07/20/15 1334 07/20/15 1347  NA 137 139  K 3.5 3.7  CL 101 100*  CO2 26  --   GLUCOSE 90 93  BUN 7 8  CREATININE 0.72 0.70  CALCIUM 9.5  --    Liver Function Tests:  Recent Labs Lab 07/20/15 1334  AST 24  ALT 23  ALKPHOS 75  BILITOT 0.8  PROT 7.5  ALBUMIN 4.1   No results for input(s): LIPASE, AMYLASE in the last 168 hours. No results for input(s): AMMONIA in the last 168 hours. CBC:  Recent Labs Lab 07/20/15 1334 07/20/15 1347  WBC 7.6  --   NEUTROABS 3.8  --   HGB 12.9 14.6  HCT 37.6 43.0  MCV 91.5  --   PLT 277  --    Cardiac Enzymes: No results for input(s): CKTOTAL, CKMB, CKMBINDEX, TROPONINI in the last 168 hours. BNP (last 3 results) No results for input(s): BNP in the last 8760 hours.  ProBNP (last 3 results) No results for input(s): PROBNP in  the last 8760 hours.  CBG: No results for input(s): GLUCAP in the last 168 hours.  Recent Results (from the past 240 hour(s))  CSF culture     Status: None (Preliminary result)   Collection Time: 07/20/15  7:00 PM  Result Value Ref Range Status   Specimen Description CSF  Final   Special Requests NONE  Final   Gram Stain   Final    WBC PRESENT,BOTH PMN AND MONONUCLEAR NO ORGANISMS SEEN CYTOSPIN    Culture NO GROWTH < 24 HOURS  Final   Report Status PENDING  Incomplete     Studies: Ct Head Wo Contrast  07/20/2015  CLINICAL DATA:  39 year old female with headache and blurred vision since this morning. Initial encounter. EXAM: CT HEAD WITHOUT CONTRAST TECHNIQUE: Contiguous axial images were obtained from the base of the skull through the vertex without intravenous contrast. COMPARISON:  None. FINDINGS: Isolated mild left sphenoid sinus opacification. Other Visualized paranasal sinuses and mastoids are clear. Hyperostosis of the calvarium. No osseous abnormality identified. Dysconjugate gaze, otherwise negative orbit and scalp soft tissues. Cerebral volume is normal. No midline shift, ventriculomegaly, mass effect, evidence of mass lesion, intracranial hemorrhage or evidence of cortically based acute infarction. Gray-white matter differentiation is within normal limits throughout the brain.  No suspicious intracranial vascular hyperdensity. IMPRESSION: Normal noncontrast CT appearance of the brain. Electronically Signed   By: Odessa Fleming M.D.   On: 07/20/2015 14:45   Mr [name] ZO Contrast  07/20/2015  CLINICAL DATA:  Diplopia, onset this morning. EXAM: MRI HEAD WITHOUT AND WITH CONTRAST TECHNIQUE: Multiplanar, multiecho pulse sequences of the brain and surrounding structures were obtained without and with intravenous contrast. CONTRAST:  10mL MULTIHANCE GADOBENATE DIMEGLUMINE 529 MG/ML IV SOLN COMPARISON:  None. FINDINGS: Calvarium and upper cervical spine: No focal marrow signal abnormality.  Orbits: Negative. No enhancement or asymmetric thickening of the optic nerves by brain MRI. Sinuses and Mastoids: Clear. Brain: There is patchy FLAIR hyperintensity within the midbrain, right more than left, around the lower third ventricle, and in the left more than right thalamus. Confluent hazy FLAIR hyperintensity also in deep white matter tracts. These areas show patchy predominately parenchymal enhancement. No predominant leptomeningeal enhancement suggestive of a basilar meningitis. No hydrocephalus or acute infarct. Major intracranial vessels are patent. These results were called by telephone at the time of interpretation on 07/20/2015 at 4:44 pm to Dr. Roxy Horseman , who verbally acknowledged these results. IMPRESSION: Signal abnormality and enhancement in the midbrain, thalami, and deep white matter tracts. A demyelinating process is favored. Distribution is atypical for multiple sclerosis and atypical infection, Bechet's, and sarcoidosis/vasculitis should be considered. Recommend correlation with LP. Electronically Signed   By: Marnee Spring M.D.   On: 07/20/2015 16:49    Scheduled Meds: . acyclovir  10 mg/kg (Ideal) Intravenous 3 times per day  . antiseptic oral rinse  7 mL Mouth Rinse BID  . bisacodyl  10 mg Oral Once  . docusate sodium  100 mg Oral BID  . feeding supplement (ENSURE ENLIVE)  237 mL Oral BID BM  . heparin  5,000 Units Subcutaneous 3 times per day  . methylPREDNISolone (SOLU-MEDROL) injection  250 mg Intravenous Q6H   Continuous Infusions:  Antibiotics Given (last 72 hours)    Date/Time Action Medication Dose Rate   07/20/15 2236 Given   acyclovir (ZOVIRAX) 570 mg in dextrose 5 % 100 mL IVPB 570 mg 111.4 mL/hr   07/21/15 0620 Given   acyclovir (ZOVIRAX) 570 mg in dextrose 5 % 100 mL IVPB 570 mg 111.4 mL/hr      Active Problems:   Demyelinating disease of central nervous system, unspecified (HCC)    Time spent:    [facility] Pager [phone]. If 7PM-7AM, please contact night-coverage at [email or web address], password [facility] 07/21/2015, 12:44 PM  LOS: 1 day

## 2015-07-21 NOTE — Progress Notes (Signed)
Initial Nutrition Assessment  DOCUMENTATION CODES:   Not applicable  INTERVENTION:  Continue Ensure Enlive po BID, each supplement provides 350 kcal and 20 grams of protein.  Encourage adequate PO intake.   NUTRITION DIAGNOSIS:   Inadequate oral intake related to poor appetite as evidenced by meal completion < 25%.  GOAL:   Patient will meet greater than or equal to 90% of their needs  MONITOR:   PO intake, Supplement acceptance, Weight trends, Labs, I & O's  REASON FOR ASSESSMENT:   Malnutrition Screening Tool    ASSESSMENT:   39 y.o. female who presents to ED with CC of diplopia. Symptoms onset this AM when she woke up. Blurred vision in constant. Numbness to sensation in left side of body. 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 No family h/o autoimmune problems.  No percent meal completion recorded, however pt reports 10-25% po intake his AM. Pt reports she usually eats well at home PTA with consumption of 2-3 meals a day with no other difficulties. Pt reports weight has been stable. Pt currently has Ensure ordered and would like to continue with them. Pt was encouraged to eat her food at meals and to drink her supplements.  Nutrition-Focused physical exam completed. Findings are no fat depletion, mild muscle depletion, and no edema.   Labs and medications reviewed.   Diet Order:  Diet regular Room service appropriate?: Yes; Fluid consistency:: Thin  Skin:  Reviewed, no issues  Last BM:  11/29  Height:   Ht Readings from Last 1 Encounters:  07/20/15 5\' 5"  (1.651 m)    Weight:   Wt Readings from Last 1 Encounters:  07/20/15 128 lb 1.4 oz (58.1 kg)    Ideal Body Weight:  56.8 kg  BMI:  Body mass index is 21.31 kg/(m^2).  Estimated Nutritional Needs:   Kcal:  1600-1900  Protein:  70-85 grams  Fluid:  1.6 - 1.9 L/day  EDUCATION NEEDS:   No education needs identified at this time  Roslyn Smiling, MS, RD, LDN Pager # [phone] After  hours/ weekend pager # [phone]

## 2015-07-22 ENCOUNTER — Inpatient Hospital Stay (HOSPITAL_COMMUNITY): Payer: BLUE CROSS/BLUE SHIELD

## 2015-07-22 DIAGNOSIS — H532 Diplopia: Secondary | ICD-10-CM

## 2015-07-22 DIAGNOSIS — R839 Unspecified abnormal finding in cerebrospinal fluid: Secondary | ICD-10-CM

## 2015-07-22 LAB — CSF IGG: IgG, CSF: 7 mg/dL (ref 0.0–8.6)

## 2015-07-22 LAB — ANGIOTENSIN CONVERTING ENZYME: ANGIOTENSIN-CONVERTING ENZYME: 71 U/L (ref 14–82)

## 2015-07-22 LAB — HERPES SIMPLEX VIRUS(HSV) DNA BY PCR
HSV 1 DNA: NEGATIVE
HSV 2 DNA: NEGATIVE

## 2015-07-22 LAB — MYELIN BASIC PROTEIN, CSF: Myelin Basic Protein: 1.2 ng/mL (ref 0.0–1.2)

## 2015-07-22 LAB — OLIGOCLONAL BANDS, CSF + SERM

## 2015-07-22 MED ORDER — LORAZEPAM 2 MG/ML IJ SOLN
INTRAMUSCULAR | Status: AC
Start: 2015-07-22 — End: 2015-07-22
  Administered 2015-07-22: 20:00:00
  Filled 2015-07-22: qty 1

## 2015-07-22 MED ORDER — LORAZEPAM 2 MG/ML IJ SOLN
INTRAMUSCULAR | Status: AC
Start: 2015-07-22 — End: 2015-07-22
  Administered 2015-07-22: 1 mg via INTRAVENOUS
  Filled 2015-07-22: qty 1

## 2015-07-22 MED ORDER — LORAZEPAM 2 MG/ML IJ SOLN
1.0000 mg | Freq: Once | INTRAMUSCULAR | Status: AC
  Administered 2015-07-22: 1 mg via INTRAVENOUS

## 2015-07-22 NOTE — Progress Notes (Signed)
[facility] PROGRESS NOTE  Michele Yu JIR:[id number] DOB: August 12, 1976 DOA: 07/20/2015 PCP: No PCP Per Patient  Assessment/Plan: 1. Diplopia/Headache -? MS vs Viral meningitis - HSV Ab negative, PCR pending - continue IV acyclovir, IV solumedrol per Neuro - Neurology following, 5days of steroids planned - HIV negative -clinically full resolution of symptoms  DVT proph: Hep SQ  Code Status: Full Code Family Communication: none at bedside Disposition Plan: home when cleared by Neuro   Consultants:  Neurology  HPI/Subjective: Feels much better, only mild photophobia-no headache, diplopia  Objective: Filed Vitals:   07/22/15 0510 07/22/15 1000  BP: 125/65 122/82  Pulse: 71 86  Temp: 98.6 F (37 C) 98.6 F (37 C)  Resp: 20 20    Intake/Output Summary (Last 24 hours) at 07/22/15 1417 Last data filed at 07/22/15 0900  Gross per 24 hour  Intake    240 ml  Output    300 ml  Net    -60 ml   Filed Weights   07/20/15 2111 07/21/15 2154  Weight: 58.1 kg (128 lb 1.4 oz) 58.3 kg (128 lb 8.5 oz)    Exam:   General:  AAOx3, mild photophobia  Cardiovascular: S1S2/RRR  Respiratory: CTAB  Abdomen: soft, NT, BS present  Musculoskeletal: no edema   Neuro: non focal  Data Reviewed: Basic Metabolic Panel:  Recent Labs Lab 07/20/15 1334 07/20/15 1347  NA 137 139  K 3.5 3.7  CL 101 100*  CO2 26  --   GLUCOSE 90 93  BUN 7 8  CREATININE 0.72 0.70  CALCIUM 9.5  --    Liver Function Tests:  Recent Labs Lab 07/20/15 1334  AST 24  ALT 23  ALKPHOS 75  BILITOT 0.8  PROT 7.5  ALBUMIN 4.1   No results for input(s): LIPASE, AMYLASE in the last 168 hours. No results for input(s): AMMONIA in the last 168 hours. CBC:  Recent Labs Lab 07/20/15 1334 07/20/15 1347  WBC 7.6  --   NEUTROABS 3.8  --   HGB 12.9 14.6  HCT 37.6 43.0  MCV 91.5  --   PLT 277  --    Cardiac Enzymes: No results for input(s): CKTOTAL, CKMB, CKMBINDEX, TROPONINI in the  last 168 hours. BNP (last 3 results) No results for input(s): BNP in the last 8760 hours.  ProBNP (last 3 results) No results for input(s): PROBNP in the last 8760 hours.  CBG: No results for input(s): GLUCAP in the last 168 hours.  Recent Results (from the past 240 hour(s))  CSF culture     Status: None (Preliminary result)   Collection Time: 07/20/15  7:00 PM  Result Value Ref Range Status   Specimen Description CSF  Final   Special Requests NONE  Final   Gram Stain   Final    WBC PRESENT,BOTH PMN AND MONONUCLEAR NO ORGANISMS SEEN CYTOSPIN    Culture NO GROWTH 2 DAYS  Final   Report Status PENDING  Incomplete     Studies: Ct Head Wo Contrast  07/20/2015  CLINICAL DATA:  39 year old female with headache and blurred vision since this morning. Initial encounter. EXAM: CT HEAD WITHOUT CONTRAST TECHNIQUE: Contiguous axial images were obtained from the base of the skull through the vertex without intravenous contrast. COMPARISON:  None. FINDINGS: Isolated mild left sphenoid sinus opacification. Other Visualized paranasal sinuses and mastoids are clear. Hyperostosis of the calvarium. No osseous abnormality identified. Dysconjugate gaze, otherwise negative orbit and scalp soft tissues. Cerebral volume is normal. No midline shift,  ventriculomegaly, mass effect, evidence of mass lesion, intracranial hemorrhage or evidence of cortically based acute infarction. Gray-white matter differentiation is within normal limits throughout the brain. No suspicious intracranial vascular hyperdensity. IMPRESSION: Normal noncontrast CT appearance of the brain. Electronically Signed   By: Odessa Fleming M.D.   On: 07/20/2015 14:45   Ct Chest W Contrast  07/21/2015  CLINICAL DATA:  39 year old female. Denies CO, SOB or cough. Ordering request evaluation for sarcoidosis. Demyelinating changes in the brain. Denies sx hx to chest/heart/lungs. EXAM: CT CHEST WITH CONTRAST TECHNIQUE: Multidetector CT imaging of the chest  was performed during intravenous contrast administration. CONTRAST:  75mL OMNIPAQUE IOHEXOL 300 MG/ML  SOLN COMPARISON:  Chest x-ray 10/02/2013 FINDINGS: Heart: Heart size is normal. No imaged pericardial effusion or significant coronary artery calcifications. Vascular structures: Normal appearance of the thoracic aorta. Pulmonary arteries are normal in appearance. Mediastinum/thyroid: The visualized portion of the thyroid gland has a normal appearance. No retroperitoneal or mesenteric adenopathy. Lungs/Airways: The lungs are clear. No pulmonary nodules or pleural effusions. Airways are patent. Upper abdomen: Unremarkable. Chest wall/osseous structures: No suspicious lytic or blastic lesions are identified. Convex right scoliosis of the thoracic spine. IMPRESSION: 1. Normal appearance of the chest. No changes to indicate sarcoidosis. 2. Scoliosis. Electronically Signed   By: Norva Pavlov M.D.   On: 07/21/2015 17:01   Mr [name] ZO Contrast  07/20/2015  CLINICAL DATA:  Diplopia, onset this morning. EXAM: MRI HEAD WITHOUT AND WITH CONTRAST TECHNIQUE: Multiplanar, multiecho pulse sequences of the brain and surrounding structures were obtained without and with intravenous contrast. CONTRAST:  10mL MULTIHANCE GADOBENATE DIMEGLUMINE 529 MG/ML IV SOLN COMPARISON:  None. FINDINGS: Calvarium and upper cervical spine: No focal marrow signal abnormality. Orbits: Negative. No enhancement or asymmetric thickening of the optic nerves by brain MRI. Sinuses and Mastoids: Clear. Brain: There is patchy FLAIR hyperintensity within the midbrain, right more than left, around the lower third ventricle, and in the left more than right thalamus. Confluent hazy FLAIR hyperintensity also in deep white matter tracts. These areas show patchy predominately parenchymal enhancement. No predominant leptomeningeal enhancement suggestive of a basilar meningitis. No hydrocephalus or acute infarct. Major intracranial vessels are patent. These  results were called by telephone at the time of interpretation on 07/20/2015 at 4:44 pm to Dr. Roxy Horseman , who verbally acknowledged these results. IMPRESSION: Signal abnormality and enhancement in the midbrain, thalami, and deep white matter tracts. A demyelinating process is favored. Distribution is atypical for multiple sclerosis and atypical infection, Bechet's, and sarcoidosis/vasculitis should be considered. Recommend correlation with LP. Electronically Signed   By: Marnee Spring M.D.   On: 07/20/2015 16:49    Scheduled Meds: . acyclovir  10 mg/kg (Ideal) Intravenous 3 times per day  . antiseptic oral rinse  7 mL Mouth Rinse BID  . bisacodyl  10 mg Oral Once  . docusate sodium  100 mg Oral BID  . famotidine  20 mg Oral BID  . feeding supplement (ENSURE ENLIVE)  237 mL Oral BID BM  . heparin  5,000 Units Subcutaneous 3 times per day  . methylPREDNISolone (SOLU-MEDROL) injection  250 mg Intravenous Q6H   Continuous Infusions:  Antibiotics Given (last 72 hours)    Date/Time Action Medication Dose Rate   07/20/15 2236 Given   acyclovir (ZOVIRAX) 570 mg in dextrose 5 % 100 mL IVPB 570 mg 111.4 mL/hr   07/21/15 0620 Given   acyclovir (ZOVIRAX) 570 mg in dextrose 5 % 100 mL IVPB 570  mg 111.4 mL/hr   07/21/15 1354 Given   acyclovir (ZOVIRAX) 570 mg in dextrose 5 % 100 mL IVPB 570 mg 111.4 mL/hr   07/21/15 2200 Given   acyclovir (ZOVIRAX) 570 mg in dextrose 5 % 100 mL IVPB 570 mg 111.4 mL/hr   07/22/15 0600 Given   acyclovir (ZOVIRAX) 570 mg in dextrose 5 % 100 mL IVPB 570 mg 111.4 mL/hr      Active Problems:   Demyelinating disease of central nervous system, unspecified (HCC)   Abnormal brain MRI   Abnormal finding in CSF   Diplopia    Time spent:    [facility]  [facility] Pager [phone]. If 7PM-7AM, please contact night-coverage at [email or web address], password [facility] 07/22/2015, 2:17 PM  LOS: 2 days

## 2015-07-22 NOTE — Progress Notes (Signed)
ANTIBIOTIC CONSULT NOTE - FOLLOW UP  Pharmacy Consult for Acyclovir Indication: r/o viral meningitis  No Known Allergies  Patient Measurements: Height:  (165.1 cm) Weight: 128 lb 8.5 oz (58.3 kg) IBW/kg (Calculated) : 57  Vital Signs: Temp: 98.6 F (37 C) (12/02 0510) Temp Source: Oral (12/02 0510) BP: 125/65 mmHg (12/02 0510) Pulse Rate: 71 (12/02 0510)    Labs:  Recent Labs  07/20/15 1334 07/20/15 1347  WBC 7.6  --   HGB 12.9 14.6  PLT 277  --   CREATININE 0.72 0.70   Microbiology: Cx data:  11/30 CSF - ng < 24 hrs so far  11/30 Rapid IV: non reactive  Anti- infective's  Acyclovir 11/30 >>   Assessment: 53 yoF admitted with acute onset of binocular horizontal diplopia. MRI revealed  Multiple enhancing lesions suspicious for multiple sclerosis per neuro. Improved since being started on steroids. MRI of thoracic and cervical spine px.   Goal of Therapy:  treatment of potential infection/improvement in outcome   Plan:  1. Continue with current dosing of acyclovir 570 mg Q8H 2. BMP for tom am 3. F/u result of labs, scans/test, cx data and adjust as needed  Pollyann Samples, PharmD, BCPS 07/22/2015, 10:15 AM Pager: [phone]

## 2015-07-22 NOTE — Progress Notes (Signed)
Subjective:  Patient feels symptoms are fully resolved, no further double vision.  Denies any new symptoms.  Overall anxious for the results of her workup  Objective: Current vital signs: BP 122/82 mmHg  Pulse 86  Temp(Src) 98.6 F (37 C) (Oral)  Resp 20  Ht  (1.651 m)  Wt 128 lb 8.5 oz (58.3 kg)  BMI 21.39 kg/m2  SpO2 100%  LMP  Vital signs in last 24 hours: Temp:  [98.6 F (37 C)-99 F (37.2 C)] 98.6 F (37 C) (12/02 1000) Pulse Rate:  [71-86] 86 (12/02 1000) Resp:  [18-20] 20 (12/02 1000) BP: (122-132)/(52-82) 122/82 mmHg (12/02 1000) SpO2:  [100 %] 100 % (12/02 1000) Weight:  [128 lb 8.5 oz (58.3 kg)] 128 lb 8.5 oz (58.3 kg) (12/01 2154)  Intake/Output from previous day: 12/01 0701 - 12/02 0700 In: 360 [P.O.:360] Out: 0  Intake/Output this shift: Total I/O In: -  Out: 300 [Urine:300] Nutritional status: Diet Heart Room service appropriate?: Yes; Fluid consistency:: Thin  Neurologic Exam:  Alert, oriented 4 fluent speech , cranial nerve 2-12  grossly intact with normal extraocular movements.  Gait assessed and wnl.  Lab Results: Basic Metabolic Panel:  Recent Labs Lab 07/20/15 1334 07/20/15 1347  NA 137 139  K 3.5 3.7  CL 101 100*  CO2 26  --   GLUCOSE 90 93  BUN 7 8  CREATININE 0.72 0.70  CALCIUM 9.5  --     Liver Function Tests:  Recent Labs Lab 07/20/15 1334  AST 24  ALT 23  ALKPHOS 75  BILITOT 0.8  PROT 7.5  ALBUMIN 4.1   No results for input(s): LIPASE, AMYLASE in the last 168 hours. No results for input(s): AMMONIA in the last 168 hours.  CBC:  Recent Labs Lab 07/20/15 1334 07/20/15 1347  WBC 7.6  --   NEUTROABS 3.8  --   HGB 12.9 14.6  HCT 37.6 43.0  MCV 91.5  --   PLT 277  --     Cardiac Enzymes: No results for input(s): CKTOTAL, CKMB, CKMBINDEX, TROPONINI in the last 168 hours.  Lipid Panel: No results for input(s): CHOL, TRIG, HDL, CHOLHDL, VLDL, LDLCALC in the last 168 hours.  CBG: No results for  input(s): GLUCAP in the last 168 hours.  Microbiology: Results for orders placed or performed during the hospital encounter of 07/20/15  CSF culture     Status: None (Preliminary result)   Collection Time: 07/20/15  7:00 PM  Result Value Ref Range Status   Specimen Description CSF  Final   Special Requests NONE  Final   Gram Stain   Final    WBC PRESENT,BOTH PMN AND MONONUCLEAR NO ORGANISMS SEEN CYTOSPIN    Culture NO GROWTH 2 DAYS  Final   Report Status PENDING  Incomplete    Coagulation Studies:  Recent Labs  07/20/15 1334  LABPROT 13.7  INR 1.03    Imaging: Ct Head Wo Contrast  07/20/2015  CLINICAL DATA:  39 year old female with headache and blurred vision since this morning. Initial encounter. EXAM: CT HEAD WITHOUT CONTRAST TECHNIQUE: Contiguous axial images were obtained from the base of the skull through the vertex without intravenous contrast. COMPARISON:  None. FINDINGS: Isolated mild left sphenoid sinus opacification. Other Visualized paranasal sinuses and mastoids are clear. Hyperostosis of the calvarium. No osseous abnormality identified. Dysconjugate gaze, otherwise negative orbit and scalp soft tissues. Cerebral volume is normal. No midline shift, ventriculomegaly, mass effect, evidence of mass lesion, intracranial hemorrhage or evidence  of cortically based acute infarction. Gray-white matter differentiation is within normal limits throughout the brain. No suspicious intracranial vascular hyperdensity. IMPRESSION: Normal noncontrast CT appearance of the brain. Electronically Signed   By: Odessa Fleming M.D.   On: 07/20/2015 14:45   Ct Chest W Contrast  07/21/2015  CLINICAL DATA:  39 year old female. Denies CO, SOB or cough. Ordering request evaluation for sarcoidosis. Demyelinating changes in the brain. Denies sx hx to chest/heart/lungs. EXAM: CT CHEST WITH CONTRAST TECHNIQUE: Multidetector CT imaging of the chest was performed during intravenous contrast administration.  CONTRAST:  75mL OMNIPAQUE IOHEXOL 300 MG/ML  SOLN COMPARISON:  Chest x-ray 10/02/2013 FINDINGS: Heart: Heart size is normal. No imaged pericardial effusion or significant coronary artery calcifications. Vascular structures: Normal appearance of the thoracic aorta. Pulmonary arteries are normal in appearance. Mediastinum/thyroid: The visualized portion of the thyroid gland has a normal appearance. No retroperitoneal or mesenteric adenopathy. Lungs/Airways: The lungs are clear. No pulmonary nodules or pleural effusions. Airways are patent. Upper abdomen: Unremarkable. Chest wall/osseous structures: No suspicious lytic or blastic lesions are identified. Convex right scoliosis of the thoracic spine. IMPRESSION: 1. Normal appearance of the chest. No changes to indicate sarcoidosis. 2. Scoliosis. Electronically Signed   By: Norva Pavlov M.D.   On: 07/21/2015 17:01   Mr [name] ZO Contrast  07/20/2015  CLINICAL DATA:  Diplopia, onset this morning. EXAM: MRI HEAD WITHOUT AND WITH CONTRAST TECHNIQUE: Multiplanar, multiecho pulse sequences of the brain and surrounding structures were obtained without and with intravenous contrast. CONTRAST:  10mL MULTIHANCE GADOBENATE DIMEGLUMINE 529 MG/ML IV SOLN COMPARISON:  None. FINDINGS: Calvarium and upper cervical spine: No focal marrow signal abnormality. Orbits: Negative. No enhancement or asymmetric thickening of the optic nerves by brain MRI. Sinuses and Mastoids: Clear. Brain: There is patchy FLAIR hyperintensity within the midbrain, right more than left, around the lower third ventricle, and in the left more than right thalamus. Confluent hazy FLAIR hyperintensity also in deep white matter tracts. These areas show patchy predominately parenchymal enhancement. No predominant leptomeningeal enhancement suggestive of a basilar meningitis. No hydrocephalus or acute infarct. Major intracranial vessels are patent. These results were called by telephone at the time of  interpretation on 07/20/2015 at 4:44 pm to Dr. Roxy Horseman , who verbally acknowledged these results. IMPRESSION: Signal abnormality and enhancement in the midbrain, thalami, and deep white matter tracts. A demyelinating process is favored. Distribution is atypical for multiple sclerosis and atypical infection, Bechet's, and sarcoidosis/vasculitis should be considered. Recommend correlation with LP. Electronically Signed   By: Marnee Spring M.D.   On: 07/20/2015 16:49    Medications:   Current facility-administered medications:  .  acyclovir (ZOVIRAX) 570 mg in dextrose 5 % 100 mL IVPB, 10 mg/kg (Ideal), Intravenous, 3 times per day, Hillary Bow, DO, 570 mg at 07/22/15 0600 .  antiseptic oral rinse (CPC / CETYLPYRIDINIUM CHLORIDE 0.05%) solution 7 mL, 7 mL, Mouth Rinse, BID, Jared M Gardner, DO, 7 mL at 07/22/15 1014 .  bisacodyl (DULCOLAX) EC tablet 10 mg, 10 mg, Oral, Once, Leanne Chang, NP, 10 mg at 07/20/15 2330 .  docusate sodium (COLACE) capsule 100 mg, 100 mg, Oral, BID, Roma Kayser Schorr, NP, 100 mg at 07/22/15 1012 .  famotidine (PEPCID) tablet 20 mg, 20 mg, Oral, BID, Ram Daniel Nones, MD, 20 mg at 07/22/15 1012 .  feeding supplement (ENSURE ENLIVE) (ENSURE ENLIVE) liquid 237 mL, 237 mL, Oral, BID BM, Jared M Gardner, DO, 237 mL at 07/21/15 1625 .  heparin injection 5,000 Units, 5,000 Units, Subcutaneous, 3 times per day, Hillary Bow, DO, 5,000 Units at 07/22/15 1914 .  methylPREDNISolone sodium succinate (SOLU-MEDROL) 250 mg in sodium chloride 0.9 % 50 mL IVPB, 250 mg, Intravenous, Q6H, Roxy Horseman, PA-C, 250 mg at 07/22/15 7829   Assessment/Plan:  39 year old African American female patient who presented with binocular horizontal diplopia yesterday with a mild left sixth nerve palsy on exam , had abnormal MRI study with enhancing lesions involving thalamus and brainstem. CSF labs were abnormal with elevated white count of 160 , with mildly elevated  protein. Underlying etiology for this or an MRI and CSF abnormalities still under investigation at this time. Clinically her symptoms are completely resolved with high dose of IV Solu-Medrol to 250 mg every 6 hours  Started yesterday at admission.  Tolerating well.  No new neurological symptoms.  nonfocal exam.   - Awaiting MRI of the cervical and thoracic spine with and without contrast to evaluate for any demyelinating spinal cord lesions.  Awaiting return of send out CSF labs, not likely to return till at least Monday.  -  CT of the chest with contrast shows no evidence of sarcoidosis, await ACE levels   - Continue IV Solu-Medrol,  We'll plan for a total of 5 days therapy.  - Patient remains on Acyclovir waiting PCR HSV results. we will follow up .   Gust Rung, DO [facility] PGY-2 Pager: [phone]

## 2015-07-23 MED ORDER — ALPRAZOLAM 0.25 MG PO TABS
0.2500 mg | ORAL_TABLET | Freq: Two times a day (BID) | ORAL | Status: DC | PRN
  Administered 2015-07-23 – 2015-07-24 (×2): 0.25 mg via ORAL
  Filled 2015-07-23 (×2): qty 1

## 2015-07-23 MED ORDER — BACLOFEN 10 MG PO TABS
10.0000 mg | ORAL_TABLET | Freq: Every day | ORAL | Status: DC
  Administered 2015-07-23 – 2015-07-24 (×2): 10 mg via ORAL
  Filled 2015-07-23 (×2): qty 1

## 2015-07-23 MED ORDER — ACETAMINOPHEN 325 MG PO TABS
650.0000 mg | ORAL_TABLET | Freq: Four times a day (QID) | ORAL | Status: DC | PRN
  Administered 2015-07-23 – 2015-07-24 (×2): 650 mg via ORAL
  Filled 2015-07-23 (×2): qty 2

## 2015-07-23 NOTE — Progress Notes (Signed)
Received call from lab stating the cytology add-on for the spinal fluid is unable to be completed due to the fact that the specimen has been refrigerated. Paged Dr. Lavon Paganini and D. Rinehuls, PA with neurology as per request from Dr. Jomarie Longs to make them aware.

## 2015-07-23 NOTE — Progress Notes (Signed)
[facility] PROGRESS NOTE  Michele Yu CVU:[id number] DOB: Aug 16, 1976 DOA: 07/20/2015 PCP: No PCP Per Patient  Assessment/Plan: 1. Diplopia/Headache -? MS vs Viral meningitis - HSV Ab negative, PCR negative - oligoclonal bands not detected - stopped IV acyclovir,  - On high dose IV solumedrol per Neuro x Day 4/5 today - HIV negative -clinically full resolution of symptoms after day1 -Home tomorrow if ok with Neuro, FU with [facility]  DVT proph: Hep SQ  Code Status: Full Code Family Communication: none at bedside Disposition Plan: home when cleared by Neuro, likley tomorrow   Consultants:  Neurology  HPI/Subjective: Feels well, no headache, diplopia  Objective: Filed Vitals:   07/23/15 0548 07/23/15 1000  BP: 129/73 134/83  Pulse: 47 90  Temp: 98.6 F (37 C) 98.3 F (36.8 C)  Resp: 14 16    Intake/Output Summary (Last 24 hours) at 07/23/15 1306 Last data filed at 07/23/15 1043  Gross per 24 hour  Intake 1046.8 ml  Output    300 ml  Net  746.8 ml   Filed Weights   07/20/15 2111 07/21/15 2154  Weight: 58.1 kg (128 lb 1.4 oz) 58.3 kg (128 lb 8.5 oz)    Exam:   General:  AAOx3, mild photophobia  Cardiovascular: S1S2/RRR  Respiratory: CTAB  Abdomen: soft, NT, BS present  Musculoskeletal: no edema   Neuro: non focal  Data Reviewed: Basic Metabolic Panel:  Recent Labs Lab 07/20/15 1334 07/20/15 1347  NA 137 139  K 3.5 3.7  CL 101 100*  CO2 26  --   GLUCOSE 90 93  BUN 7 8  CREATININE 0.72 0.70  CALCIUM 9.5  --    Liver Function Tests:  Recent Labs Lab 07/20/15 1334  AST 24  ALT 23  ALKPHOS 75  BILITOT 0.8  PROT 7.5  ALBUMIN 4.1   No results for input(s): LIPASE, AMYLASE in the last 168 hours. No results for input(s): AMMONIA in the last 168 hours. CBC:  Recent Labs Lab 07/20/15 1334 07/20/15 1347  WBC 7.6  --   NEUTROABS 3.8  --   HGB 12.9 14.6  HCT 37.6 43.0  MCV 91.5  --   PLT 277  --    Cardiac  Enzymes: No results for input(s): CKTOTAL, CKMB, CKMBINDEX, TROPONINI in the last 168 hours. BNP (last 3 results) No results for input(s): BNP in the last 8760 hours.  ProBNP (last 3 results) No results for input(s): PROBNP in the last 8760 hours.  CBG: No results for input(s): GLUCAP in the last 168 hours.  Recent Results (from the past 240 hour(s))  CSF culture     Status: None (Preliminary result)   Collection Time: 07/20/15  7:00 PM  Result Value Ref Range Status   Specimen Description CSF  Final   Special Requests NONE  Final   Gram Stain   Final    WBC PRESENT,BOTH PMN AND MONONUCLEAR NO ORGANISMS SEEN CYTOSPIN    Culture NO GROWTH 3 DAYS  Final   Report Status PENDING  Incomplete     Studies: Ct Chest W Contrast  07/21/2015  CLINICAL DATA:  39 year old female. Denies CO, SOB or cough. Ordering request evaluation for sarcoidosis. Demyelinating changes in the brain. Denies sx hx to chest/heart/lungs. EXAM: CT CHEST WITH CONTRAST TECHNIQUE: Multidetector CT imaging of the chest was performed during intravenous contrast administration. CONTRAST:  59mL OMNIPAQUE IOHEXOL 300 MG/ML  SOLN COMPARISON:  Chest x-ray 10/02/2013 FINDINGS: Heart: Heart size is normal. No imaged  pericardial effusion or significant coronary artery calcifications. Vascular structures: Normal appearance of the thoracic aorta. Pulmonary arteries are normal in appearance. Mediastinum/thyroid: The visualized portion of the thyroid gland has a normal appearance. No retroperitoneal or mesenteric adenopathy. Lungs/Airways: The lungs are clear. No pulmonary nodules or pleural effusions. Airways are patent. Upper abdomen: Unremarkable. Chest wall/osseous structures: No suspicious lytic or blastic lesions are identified. Convex right scoliosis of the thoracic spine. IMPRESSION: 1. Normal appearance of the chest. No changes to indicate sarcoidosis. 2. Scoliosis. Electronically Signed   By: Norva Pavlov M.D.   On:  07/21/2015 17:01   Mr Cervical Spine Wo Contrast  07/22/2015  CLINICAL DATA:  Horizontal diplopia beginning yesterday, 6 nerve palsy on examination. Skullbase enhancing lesion seen on MRI of the brain, follow-up evaluation. EXAM: MRI CERVICAL SPINE WITHOUT CONTRAST TECHNIQUE: Multiplanar, multisequence MR imaging of the cervical spine was performed. No intravenous contrast was administered. COMPARISON:  None. FINDINGS: Cervical vertebral bodies and posterior elements are intact and aligned, straightened cervical lordosis on this moderately motion degraded examination. Intervertebral discs demonstrate normal morphology and signal characteristics. No STIR signal abnormality to suggest acute osseous process. Cervical spinal cord appears normal in morphology and signal characteristics though, patient motion degrades sensitivity for subtle potential signal abnormality. Included prevertebral and paraspinal soft tissues are nonsuspicious. Limited assessment of fluid levels due to patient motion. C2-3, C3-4: No definite disc bulge, canal stenosis or neural foraminal narrowing. C4-5: Small broad-based disc bulge without definite neural foraminal narrowing. Minimal canal stenosis. C5-6: Small broad-based RIGHT central disc protrusion resulting in mild canal stenosis without definite neural foraminal narrowing. C6-7 and C7-T1: No disc bulge, canal stenosis nor neural foraminal narrowing. IMPRESSION: No acute fracture or malalignment on this moderately motion degraded examination. No abnormal signal within the nonenhanced motion degraded cervical spine though, if suspicion persists, recommend repeat evaluation when patient is better able to remain still and, with administration of contrast. Mild canal stenosis at C5-6, minimal at C4-5. No definite neural foraminal narrowing though motion degrades assessment. Electronically Signed   By: Awilda Metro M.D.   On: 07/22/2015 22:26   Mr Thoracic Spine W Wo  Contrast  07/22/2015  CLINICAL DATA:  Horizontal diplopia beginning yesterday, 6 nerve palsy on examination. Skullbase enhancing lesion seen on MRI of the brain, follow-up evaluation. EXAM: MRI CERVICAL SPINE WITHOUT CONTRAST TECHNIQUE: Multiplanar, multisequence MR imaging of the cervical spine was performed. No intravenous contrast was administered. COMPARISON:  None. FINDINGS: Cervical vertebral bodies and posterior elements are intact and aligned, straightened cervical lordosis on this moderately motion degraded examination. Intervertebral discs demonstrate normal morphology and signal characteristics. No STIR signal abnormality to suggest acute osseous process. Cervical spinal cord appears normal in morphology and signal characteristics though, patient motion degrades sensitivity for subtle potential signal abnormality. Included prevertebral and paraspinal soft tissues are nonsuspicious. Limited assessment of fluid levels due to patient motion. C2-3, C3-4: No definite disc bulge, canal stenosis or neural foraminal narrowing. C4-5: Small broad-based disc bulge without definite neural foraminal narrowing. Minimal canal stenosis. C5-6: Small broad-based RIGHT central disc protrusion resulting in mild canal stenosis without definite neural foraminal narrowing. C6-7 and C7-T1: No disc bulge, canal stenosis nor neural foraminal narrowing. IMPRESSION: No acute fracture or malalignment on this moderately motion degraded examination. No abnormal signal within the nonenhanced motion degraded cervical spine though, if suspicion persists, recommend repeat evaluation when patient is better able to remain still and, with administration of contrast. Mild canal stenosis at C5-6, minimal at C4-5. No  definite neural foraminal narrowing though motion degrades assessment. Electronically Signed   By: Awilda Metro M.D.   On: 07/22/2015 22:26    Scheduled Meds: . antiseptic oral rinse  7 mL Mouth Rinse BID  . bisacodyl  10  mg Oral Once  . docusate sodium  100 mg Oral BID  . famotidine  20 mg Oral BID  . feeding supplement (ENSURE ENLIVE)  237 mL Oral BID BM  . heparin  5,000 Units Subcutaneous 3 times per day  . methylPREDNISolone (SOLU-MEDROL) injection  250 mg Intravenous Q6H   Continuous Infusions:  Antibiotics Given (last 72 hours)    Date/Time Action Medication Dose Rate   07/20/15 2236 Given   acyclovir (ZOVIRAX) 570 mg in dextrose 5 % 100 mL IVPB 570 mg 111.4 mL/hr   07/21/15 0620 Given   acyclovir (ZOVIRAX) 570 mg in dextrose 5 % 100 mL IVPB 570 mg 111.4 mL/hr   07/21/15 1354 Given   acyclovir (ZOVIRAX) 570 mg in dextrose 5 % 100 mL IVPB 570 mg 111.4 mL/hr   07/21/15 2200 Given   acyclovir (ZOVIRAX) 570 mg in dextrose 5 % 100 mL IVPB 570 mg 111.4 mL/hr   07/22/15 0600 Given   acyclovir (ZOVIRAX) 570 mg in dextrose 5 % 100 mL IVPB 570 mg 111.4 mL/hr   07/22/15 1650 Given   acyclovir (ZOVIRAX) 570 mg in dextrose 5 % 100 mL IVPB 570 mg 111.4 mL/hr   07/22/15 2243 Given   acyclovir (ZOVIRAX) 570 mg in dextrose 5 % 100 mL IVPB 570 mg 111.4 mL/hr   07/23/15 0620 Given   acyclovir (ZOVIRAX) 570 mg in dextrose 5 % 100 mL IVPB 570 mg 111.4 mL/hr      Active Problems:   Demyelinating disease of central nervous system, unspecified (HCC)   Abnormal brain MRI   Abnormal finding in CSF   Diplopia    Time spent:    [facility]  [facility] Pager [phone]. If 7PM-7AM, please contact night-coverage at [email or web address], password [facility] 07/23/2015, 1:06 PM  LOS: 3 days

## 2015-07-23 NOTE — Progress Notes (Signed)
Subjective:   39 year old female patient admitted with the diplopia with a mild left sixth nerve palsy yesterday. She started on IV Solu-Medrol to 250 mg every 6 hours. Her diplopia symptoms are completely resolved at this time. No new neurological symptoms. No side effects from Solu-Medrol.   Brain MRI showed enhancing lesions in bilateral thalami, right midbrain, with T2 hyperintensity signal changes seen in the right posterior midbrain. CSF studies are abnormal the elevated white count of 160 , culture and Gram stain has been negative so far , protein mildly elevated.  Other CSF labs show negative HSV PCR, negative oligoclonal bands and myelin basic protein,  negative IgG index, MRI of the C-spine and thoracic spine are unrevealing, CT of the chest showed no evidence of sarcoidosis, serum ACE level within normal limits.    Discontinued iv acyclovir with negative CSF HSV PCR results.   Objective: Current vital signs: BP 161/78 mmHg  Pulse 55  Temp(Src) 98.4 F (36.9 C) (Oral)  Resp 18  Ht  (1.651 m)  Wt 58.3 kg (128 lb 8.5 oz)  BMI 21.39 kg/m2  SpO2 100%  LMP  Vital signs in last 24 hours: Temp:  [98.3 F (36.8 C)-98.6 F (37 C)] 98.4 F (36.9 C) (12/03 1708) Pulse Rate:  [47-90] 55 (12/03 1708) Resp:  [14-18] 18 (12/03 1708) BP: (129-161)/(73-83) 161/78 mmHg (12/03 1708) SpO2:  [100 %] 100 % (12/03 1708)  Intake/Output from previous day: 12/02 0701 - 12/03 0700 In: 806.8 [P.O.:480; IV Piggyback:326.8] Out: 600 [Urine:600] Intake/Output this shift:   Nutritional status: Diet Heart Room service appropriate?: Yes; Fluid consistency:: Thin  Neurologic Exam:  Alert, oriented 4 fluent speech , cranial nerve II 12 grossly intact. Resolved diplopia , normal extraocular movements.  Full motor strength.  Minimal gait instability noted, improved from yesterday  Lab Results: Basic Metabolic Panel:  Recent Labs Lab 07/20/15 1334 07/20/15 1347  NA 137 139  K 3.5 3.7  CL  101 100*  CO2 26  --   GLUCOSE 90 93  BUN 7 8  CREATININE 0.72 0.70  CALCIUM 9.5  --     Liver Function Tests:  Recent Labs Lab 07/20/15 1334  AST 24  ALT 23  ALKPHOS 75  BILITOT 0.8  PROT 7.5  ALBUMIN 4.1   No results for input(s): LIPASE, AMYLASE in the last 168 hours. No results for input(s): AMMONIA in the last 168 hours.  CBC:  Recent Labs Lab 07/20/15 1334 07/20/15 1347  WBC 7.6  --   NEUTROABS 3.8  --   HGB 12.9 14.6  HCT 37.6 43.0  MCV 91.5  --   PLT 277  --     Cardiac Enzymes: No results for input(s): CKTOTAL, CKMB, CKMBINDEX, TROPONINI in the last 168 hours.  Lipid Panel: No results for input(s): CHOL, TRIG, HDL, CHOLHDL, VLDL, LDLCALC in the last 168 hours.  CBG: No results for input(s): GLUCAP in the last 168 hours.  Microbiology: Results for orders placed or performed during the hospital encounter of 07/20/15  CSF culture     Status: None (Preliminary result)   Collection Time: 07/20/15  7:00 PM  Result Value Ref Range Status   Specimen Description CSF  Final   Special Requests NONE  Final   Gram Stain   Final    WBC PRESENT,BOTH PMN AND MONONUCLEAR NO ORGANISMS SEEN CYTOSPIN    Culture NO GROWTH 3 DAYS  Final   Report Status PENDING  Incomplete    Coagulation Studies: No  results for input(s): LABPROT, INR in the last 72 hours.  Imaging: Mr Cervical Spine Wo Contrast  07/22/2015  CLINICAL DATA:  Horizontal diplopia beginning yesterday, 6 nerve palsy on examination. Skullbase enhancing lesion seen on MRI of the brain, follow-up evaluation. EXAM: MRI CERVICAL SPINE WITHOUT CONTRAST TECHNIQUE: Multiplanar, multisequence MR imaging of the cervical spine was performed. No intravenous contrast was administered. COMPARISON:  None. FINDINGS: Cervical vertebral bodies and posterior elements are intact and aligned, straightened cervical lordosis on this moderately motion degraded examination. Intervertebral discs demonstrate normal morphology and  signal characteristics. No STIR signal abnormality to suggest acute osseous process. Cervical spinal cord appears normal in morphology and signal characteristics though, patient motion degrades sensitivity for subtle potential signal abnormality. Included prevertebral and paraspinal soft tissues are nonsuspicious. Limited assessment of fluid levels due to patient motion. C2-3, C3-4: No definite disc bulge, canal stenosis or neural foraminal narrowing. C4-5: Small broad-based disc bulge without definite neural foraminal narrowing. Minimal canal stenosis. C5-6: Small broad-based RIGHT central disc protrusion resulting in mild canal stenosis without definite neural foraminal narrowing. C6-7 and C7-T1: No disc bulge, canal stenosis nor neural foraminal narrowing. IMPRESSION: No acute fracture or malalignment on this moderately motion degraded examination. No abnormal signal within the nonenhanced motion degraded cervical spine though, if suspicion persists, recommend repeat evaluation when patient is better able to remain still and, with administration of contrast. Mild canal stenosis at C5-6, minimal at C4-5. No definite neural foraminal narrowing though motion degrades assessment. Electronically Signed   By: Awilda Metro M.D.   On: 07/22/2015 22:26   Mr Thoracic Spine W Wo Contrast  07/22/2015  CLINICAL DATA:  Horizontal diplopia beginning yesterday, 6 nerve palsy on examination. Skullbase enhancing lesion seen on MRI of the brain, follow-up evaluation. EXAM: MRI CERVICAL SPINE WITHOUT CONTRAST TECHNIQUE: Multiplanar, multisequence MR imaging of the cervical spine was performed. No intravenous contrast was administered. COMPARISON:  None. FINDINGS: Cervical vertebral bodies and posterior elements are intact and aligned, straightened cervical lordosis on this moderately motion degraded examination. Intervertebral discs demonstrate normal morphology and signal characteristics. No STIR signal abnormality to  suggest acute osseous process. Cervical spinal cord appears normal in morphology and signal characteristics though, patient motion degrades sensitivity for subtle potential signal abnormality. Included prevertebral and paraspinal soft tissues are nonsuspicious. Limited assessment of fluid levels due to patient motion. C2-3, C3-4: No definite disc bulge, canal stenosis or neural foraminal narrowing. C4-5: Small broad-based disc bulge without definite neural foraminal narrowing. Minimal canal stenosis. C5-6: Small broad-based RIGHT central disc protrusion resulting in mild canal stenosis without definite neural foraminal narrowing. C6-7 and C7-T1: No disc bulge, canal stenosis nor neural foraminal narrowing. IMPRESSION: No acute fracture or malalignment on this moderately motion degraded examination. No abnormal signal within the nonenhanced motion degraded cervical spine though, if suspicion persists, recommend repeat evaluation when patient is better able to remain still and, with administration of contrast. Mild canal stenosis at C5-6, minimal at C4-5. No definite neural foraminal narrowing though motion degrades assessment. Electronically Signed   By: Awilda Metro M.D.   On: 07/22/2015 22:26    Medications:   Current facility-administered medications:  .  acetaminophen (TYLENOL) tablet 650 mg, 650 mg, Oral, Q6H PRN, Zannie Cove, MD .  ALPRAZolam Prudy Feeler) tablet 0.25 mg, 0.25 mg, Oral, BID PRN, Zannie Cove, MD .  antiseptic oral rinse (CPC / CETYLPYRIDINIUM CHLORIDE 0.05%) solution 7 mL, 7 mL, Mouth Rinse, BID, Jared M Gardner, DO, 7 mL at 07/23/15 1004 .  bisacodyl (DULCOLAX) EC tablet 10 mg, 10 mg, Oral, Once, Leanne Chang, NP, 10 mg at 07/20/15 2330 .  docusate sodium (COLACE) capsule 100 mg, 100 mg, Oral, BID, Roma Kayser Schorr, NP, 100 mg at 07/23/15 1003 .  famotidine (PEPCID) tablet 20 mg, 20 mg, Oral, BID, Shoaib Siefker Daniel Nones, MD, 20 mg at 07/23/15 1003 .  feeding  supplement (ENSURE ENLIVE) (ENSURE ENLIVE) liquid 237 mL, 237 mL, Oral, BID BM, Jared M Gardner, DO, 237 mL at 07/23/15 1613 .  heparin injection 5,000 Units, 5,000 Units, Subcutaneous, 3 times per day, Hillary Bow, DO, 5,000 Units at 07/23/15 1613 .  methylPREDNISolone sodium succinate (SOLU-MEDROL) 250 mg in sodium chloride 0.9 % 50 mL IVPB, 250 mg, Intravenous, Q6H, Roxy Horseman, PA-C, 250 mg at 07/23/15 1614   Assessment/Plan:  40 year old African American female patient who presented with binocular horizontal diplopia with a mild left sixth nerve palsy on exam , had abnormal MRI study with enhancing lesions involving thalamus and brainstem. CSF labs were abnormal with elevated white count of 160 , with mildly elevated protein. Underlying etiology for this MRI and CSF abnormalities is unclear at this time. Clinically, her symptoms have completely resolved with high dose of IV Solu-Medrol 250 mg every 6 hours. Recommend a total of 5 days of IV Solu-Medrol therapy as inpatient.  Several other CSF studies, MRI of the cervical thoracic spine and CT chest have all been unrevealing. No specific diagnosis for these abnormal enhancing lesions is available at this time. She can be discharged home after completing 5 days of supplemental therapy, recommend follow-up with outpatient neurology in 2 weeks. She needs a repeat MRI of the brain with and without contrast to be done through outpatient neurology follow-up to monitor for stability and resolution of the intracranial lesions.  Neurology service will continue to follow up during her hospitalization. Please call for any further questions.

## 2015-07-23 NOTE — Progress Notes (Signed)
Received call from MRI tech stating patient is unable to stay still for MRI.  She was given 1 mg IV lorazepam twice with minimal results.  Called neurologist on call and advised of the above.  No new orders received.  Will continue to monitor patient.  Bernie Covey RN-BC, [employer]

## 2015-07-24 DIAGNOSIS — R930 Abnormal findings on diagnostic imaging of skull and head, not elsewhere classified: Secondary | ICD-10-CM

## 2015-07-24 LAB — CSF CULTURE: CULTURE: NO GROWTH

## 2015-07-24 LAB — CSF CULTURE W GRAM STAIN

## 2015-07-24 NOTE — Progress Notes (Signed)
[facility] PROGRESS NOTE  Michele Yu YQI:[id number] DOB: 21-Jun-1976 DOA: 07/20/2015 PCP: No PCP Per Patient  Assessment/Plan: 1. Diplopia/Headache -? MS vs Viral meningitis, MRI with Signal abnormality & enhancement in midbrain, thalami, and deep white matter tracts - HSV Ab negative, PCR negative - oligoclonal bands not detected - stopped IV acyclovir,  - On high dose IV solumedrol per Neuro x today is Day 4/5  - HIV negative -clinically full resolution of symptoms after day1 -Home later today or tomorrow per Neuro recs, FU with [facility], suspect she will not need any new meds at DC -had some restlessness and anxiety last pm which i think is related to her steroids  DVT proph: Hep SQ  Code Status: Full Code Family Communication: none at bedside Disposition Plan: home when cleared by Neuro, likley tomorrow   Consultants:  Neurology  HPI/Subjective: Feels well, no headache, diplopia  Objective: Filed Vitals:   07/24/15 0548 07/24/15 0841  BP: 146/69 118/84  Pulse: 44 60  Temp: 97.7 F (36.5 C) 98.2 F (36.8 C)  Resp: 18 18    Intake/Output Summary (Last 24 hours) at 07/24/15 1331 Last data filed at 07/24/15 0945  Gross per 24 hour  Intake   1184 ml  Output   1200 ml  Net    -16 ml   Filed Weights   07/20/15 2111 07/21/15 2154 07/23/15 2022  Weight: 58.1 kg (128 lb 1.4 oz) 58.3 kg (128 lb 8.5 oz) 60.827 kg (134 lb 1.6 oz)    Exam:   General:  AAOx3, mild photophobia  Cardiovascular: S1S2/RRR  Respiratory: CTAB  Abdomen: soft, NT, BS present  Musculoskeletal: no edema   Neuro: non focal  Data Reviewed: Basic Metabolic Panel:  Recent Labs Lab 07/20/15 1334 07/20/15 1347  NA 137 139  K 3.5 3.7  CL 101 100*  CO2 26  --   GLUCOSE 90 93  BUN 7 8  CREATININE 0.72 0.70  CALCIUM 9.5  --    Liver Function Tests:  Recent Labs Lab 07/20/15 1334  AST 24  ALT 23  ALKPHOS 75  BILITOT 0.8  PROT 7.5  ALBUMIN 4.1   No  results for input(s): LIPASE, AMYLASE in the last 168 hours. No results for input(s): AMMONIA in the last 168 hours. CBC:  Recent Labs Lab 07/20/15 1334 07/20/15 1347  WBC 7.6  --   NEUTROABS 3.8  --   HGB 12.9 14.6  HCT 37.6 43.0  MCV 91.5  --   PLT 277  --    Cardiac Enzymes: No results for input(s): CKTOTAL, CKMB, CKMBINDEX, TROPONINI in the last 168 hours. BNP (last 3 results) No results for input(s): BNP in the last 8760 hours.  ProBNP (last 3 results) No results for input(s): PROBNP in the last 8760 hours.  CBG: No results for input(s): GLUCAP in the last 168 hours.  Recent Results (from the past 240 hour(s))  CSF culture     Status: None   Collection Time: 07/20/15  7:00 PM  Result Value Ref Range Status   Specimen Description CSF  Final   Special Requests NONE  Final   Gram Stain   Final    WBC PRESENT,BOTH PMN AND MONONUCLEAR NO ORGANISMS SEEN CYTOSPIN    Culture NO GROWTH 3 DAYS  Final   Report Status 07/24/2015 FINAL  Final     Studies: Mr Cervical Spine Wo Contrast  07/22/2015  CLINICAL DATA:  Horizontal diplopia beginning yesterday, 6 nerve palsy on examination. Skullbase  enhancing lesion seen on MRI of the brain, follow-up evaluation. EXAM: MRI CERVICAL SPINE WITHOUT CONTRAST TECHNIQUE: Multiplanar, multisequence MR imaging of the cervical spine was performed. No intravenous contrast was administered. COMPARISON:  None. FINDINGS: Cervical vertebral bodies and posterior elements are intact and aligned, straightened cervical lordosis on this moderately motion degraded examination. Intervertebral discs demonstrate normal morphology and signal characteristics. No STIR signal abnormality to suggest acute osseous process. Cervical spinal cord appears normal in morphology and signal characteristics though, patient motion degrades sensitivity for subtle potential signal abnormality. Included prevertebral and paraspinal soft tissues are nonsuspicious. Limited  assessment of fluid levels due to patient motion. C2-3, C3-4: No definite disc bulge, canal stenosis or neural foraminal narrowing. C4-5: Small broad-based disc bulge without definite neural foraminal narrowing. Minimal canal stenosis. C5-6: Small broad-based RIGHT central disc protrusion resulting in mild canal stenosis without definite neural foraminal narrowing. C6-7 and C7-T1: No disc bulge, canal stenosis nor neural foraminal narrowing. IMPRESSION: No acute fracture or malalignment on this moderately motion degraded examination. No abnormal signal within the nonenhanced motion degraded cervical spine though, if suspicion persists, recommend repeat evaluation when patient is better able to remain still and, with administration of contrast. Mild canal stenosis at C5-6, minimal at C4-5. No definite neural foraminal narrowing though motion degrades assessment. Electronically Signed   By: Awilda Metro M.D.   On: 07/22/2015 22:26   Mr Thoracic Spine W Wo Contrast  07/22/2015  CLINICAL DATA:  Horizontal diplopia beginning yesterday, 6 nerve palsy on examination. Skullbase enhancing lesion seen on MRI of the brain, follow-up evaluation. EXAM: MRI CERVICAL SPINE WITHOUT CONTRAST TECHNIQUE: Multiplanar, multisequence MR imaging of the cervical spine was performed. No intravenous contrast was administered. COMPARISON:  None. FINDINGS: Cervical vertebral bodies and posterior elements are intact and aligned, straightened cervical lordosis on this moderately motion degraded examination. Intervertebral discs demonstrate normal morphology and signal characteristics. No STIR signal abnormality to suggest acute osseous process. Cervical spinal cord appears normal in morphology and signal characteristics though, patient motion degrades sensitivity for subtle potential signal abnormality. Included prevertebral and paraspinal soft tissues are nonsuspicious. Limited assessment of fluid levels due to patient motion. C2-3,  C3-4: No definite disc bulge, canal stenosis or neural foraminal narrowing. C4-5: Small broad-based disc bulge without definite neural foraminal narrowing. Minimal canal stenosis. C5-6: Small broad-based RIGHT central disc protrusion resulting in mild canal stenosis without definite neural foraminal narrowing. C6-7 and C7-T1: No disc bulge, canal stenosis nor neural foraminal narrowing. IMPRESSION: No acute fracture or malalignment on this moderately motion degraded examination. No abnormal signal within the nonenhanced motion degraded cervical spine though, if suspicion persists, recommend repeat evaluation when patient is better able to remain still and, with administration of contrast. Mild canal stenosis at C5-6, minimal at C4-5. No definite neural foraminal narrowing though motion degrades assessment. Electronically Signed   By: Awilda Metro M.D.   On: 07/22/2015 22:26    Scheduled Meds: . antiseptic oral rinse  7 mL Mouth Rinse BID  . baclofen  10 mg Oral QHS  . bisacodyl  10 mg Oral Once  . docusate sodium  100 mg Oral BID  . famotidine  20 mg Oral BID  . feeding supplement (ENSURE ENLIVE)  237 mL Oral BID BM  . heparin  5,000 Units Subcutaneous 3 times per day  . methylPREDNISolone (SOLU-MEDROL) injection  250 mg Intravenous Q6H   Continuous Infusions:  Antibiotics Given (last 72 hours)    Date/Time Action Medication Dose Rate   07/21/15  1354 Given   acyclovir (ZOVIRAX) 570 mg in dextrose 5 % 100 mL IVPB 570 mg 111.4 mL/hr   07/21/15 2200 Given   acyclovir (ZOVIRAX) 570 mg in dextrose 5 % 100 mL IVPB 570 mg 111.4 mL/hr   07/22/15 0600 Given   acyclovir (ZOVIRAX) 570 mg in dextrose 5 % 100 mL IVPB 570 mg 111.4 mL/hr   07/22/15 1650 Given   acyclovir (ZOVIRAX) 570 mg in dextrose 5 % 100 mL IVPB 570 mg 111.4 mL/hr   07/22/15 2243 Given   acyclovir (ZOVIRAX) 570 mg in dextrose 5 % 100 mL IVPB 570 mg 111.4 mL/hr   07/23/15 0620 Given   acyclovir (ZOVIRAX) 570 mg in dextrose 5 % 100  mL IVPB 570 mg 111.4 mL/hr      Active Problems:   Demyelinating disease of central nervous system, unspecified (HCC)   Abnormal brain MRI   Abnormal finding in CSF   Diplopia    Time spent:    [facility]  [facility] Pager [phone]. If 7PM-7AM, please contact night-coverage at [email or web address], password [facility] 07/24/2015, 1:31 PM  LOS: 4 days

## 2015-07-25 LAB — NEUROMYELITIS OPTICA AUTOAB, IGG: NMO-IgG: <1.5 U/mL (ref 0.0–3.0)

## 2015-07-25 MED ORDER — ACETAMINOPHEN 325 MG PO TABS: 650.0000 mg | ORAL_TABLET | Freq: Four times a day (QID) | ORAL | Status: DC | PRN

## 2015-07-25 NOTE — Progress Notes (Signed)
Subjective: Patient reports she is feeling much better, she is anxious to go home.  She denies any headache, double vision, numbness.  Objective: Current vital signs: BP 137/90 mmHg  Pulse 64  Temp(Src) 98.7 F (37.1 C) (Oral)  Resp 16  Ht  (1.651 m)  Wt 132 lb 7.9 oz (60.1 kg)  BMI 22.05 kg/m2  SpO2 100%  LMP  Vital signs in last 24 hours: Temp:  [97.3 F (36.3 C)-98.7 F (37.1 C)] 98.7 F (37.1 C) (12/05 0938) Pulse Rate:  [47-64] 64 (12/05 0938) Resp:  [14-18] 16 (12/05 0938) BP: (137-163)/(75-91) 137/90 mmHg (12/05 0938) SpO2:  [99 %-100 %] 100 % (12/05 0938) Weight:  [132 lb 7.9 oz (60.1 kg)] 132 lb 7.9 oz (60.1 kg) (12/04 2127)  Intake/Output from previous day: 12/04 0701 - 12/05 0700 In: 1268 [P.O.:1164; IV Piggyback:104] Out: 900 [Urine:900] Intake/Output this shift:   Nutritional status: Diet Heart Room service appropriate?: Yes; Fluid consistency:: Thin  Neurologic Exam:  Alert, oriented 4 fluent speech , cranial nerve 2-12  grossly intact with normal extraocular movements.  UE strength 5/5 bilaterally, LE strength 5/5 bilaterally.    Lab Results: Basic Metabolic Panel:  Recent Labs Lab 07/20/15 1334 07/20/15 1347  NA 137 139  K 3.5 3.7  CL 101 100*  CO2 26  --   GLUCOSE 90 93  BUN 7 8  CREATININE 0.72 0.70  CALCIUM 9.5  --     Liver Function Tests:  Recent Labs Lab 07/20/15 1334  AST 24  ALT 23  ALKPHOS 75  BILITOT 0.8  PROT 7.5  ALBUMIN 4.1   No results for input(s): LIPASE, AMYLASE in the last 168 hours. No results for input(s): AMMONIA in the last 168 hours.  CBC:  Recent Labs Lab 07/20/15 1334 07/20/15 1347  WBC 7.6  --   NEUTROABS 3.8  --   HGB 12.9 14.6  HCT 37.6 43.0  MCV 91.5  --   PLT 277  --     Cardiac Enzymes: No results for input(s): CKTOTAL, CKMB, CKMBINDEX, TROPONINI in the last 168 hours.  Lipid Panel: No results for input(s): CHOL, TRIG, HDL, CHOLHDL, VLDL, LDLCALC in the last 168  hours.  CBG: No results for input(s): GLUCAP in the last 168 hours.  Microbiology: Results for orders placed or performed during the hospital encounter of 07/20/15  CSF culture     Status: None   Collection Time: 07/20/15  7:00 PM  Result Value Ref Range Status   Specimen Description CSF  Final   Special Requests NONE  Final   Gram Stain   Final    WBC PRESENT,BOTH PMN AND MONONUCLEAR NO ORGANISMS SEEN CYTOSPIN    Culture NO GROWTH 3 DAYS  Final   Report Status 07/24/2015 FINAL  Final    Coagulation Studies: No results for input(s): LABPROT, INR in the last 72 hours.  Imaging: No results found.  Medications:   Current facility-administered medications:  .  acetaminophen (TYLENOL) tablet 650 mg, 650 mg, Oral, Q6H PRN, Zannie Cove, MD, 650 mg at 07/24/15 2103 .  ALPRAZolam Prudy Feeler) tablet 0.25 mg, 0.25 mg, Oral, BID PRN, Zannie Cove, MD, 0.25 mg at 07/24/15 2103 .  antiseptic oral rinse (CPC / CETYLPYRIDINIUM CHLORIDE 0.05%) solution 7 mL, 7 mL, Mouth Rinse, BID, Jared M Gardner, DO, 7 mL at 07/24/15 2200 .  baclofen (LIORESAL) tablet 10 mg, 10 mg, Oral, QHS, Ram Daniel Nones, MD, 10 mg at 07/24/15 2103 .  bisacodyl (DULCOLAX)  EC tablet 10 mg, 10 mg, Oral, Once, Leanne Chang, NP, 10 mg at 07/20/15 2330 .  docusate sodium (COLACE) capsule 100 mg, 100 mg, Oral, BID, Roma Kayser Schorr, NP, 100 mg at 07/24/15 2102 .  famotidine (PEPCID) tablet 20 mg, 20 mg, Oral, BID, Ram Daniel Nones, MD, 20 mg at 07/24/15 2102 .  feeding supplement (ENSURE ENLIVE) (ENSURE ENLIVE) liquid 237 mL, 237 mL, Oral, BID BM, Jared M Gardner, DO, 237 mL at 07/23/15 1613 .  heparin injection 5,000 Units, 5,000 Units, Subcutaneous, 3 times per day, Hillary Bow, DO, 5,000 Units at 07/23/15 1613 .  methylPREDNISolone sodium succinate (SOLU-MEDROL) 250 mg in sodium chloride 0.9 % 50 mL IVPB, 250 mg, Intravenous, Q6H, Zannie Cove, MD, 250 mg at 07/25/15  [id number]   Assessment/Plan:  39 year old African American female patient who presented with binocular horizontal diplopia yesterday with a mild left sixth nerve palsy on exam , had abnormal MRI study with enhancing lesions involving thalamus and brainstem. CSF labs were abnormal with elevated white count of 160 , with mildly elevated protein. Underlying etiology for this or an MRI and CSF abnormalities still under investigation at this time. Clinically her symptoms are completely resolved with high dose of IV Solu-Medrol to 250 mg every 6 hours.  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  Thoracic spine MRI unable to be completed but likely do not need.  Awaiting return of send out remainder of CSF labs.  Patient can have repeat MRI of brain as outpatient with her follow up.  -  IV Solu-Medrol last dose is today - HSV PCR negative and acyclovir discontinued - Patient to be discharged today with outpatient follow up by neurology.  Gust Rung, DO [facility] PGY-3 Pager: [phone] 07/25/2015 9:49 AM    Was unable to see prior to patient discharge. Patient will follow up with outpatient neurology.   Ritta Slot, MD [facility] [phone]  If 7pm- 7am, please page neurology on call as listed in AMION.

## 2015-07-25 NOTE — Discharge Summary (Signed)
Physician Discharge Summary  Michele Yu BJY:[id number] DOB: 1976/02/11 DOA: 07/20/2015  PCP: No PCP Per Patient  Admit date: 07/20/2015 Discharge date: 07/25/2015  Time spent: 45 minutes  Recommendations for Outpatient Follow-up:  1. Dr.Richard Paschal Dopp Neurology 12/14 at 10am 2. Needs repeat MRI Brain in 1 month   Discharge Diagnoses:    ? Viral encephalitis   Demyelinating disease of central nervous system, unspecified (HCC)   Abnormal brain MRI   Abnormal finding in CSF   Diplopia   Discharge Condition: stable  Diet recommendation: regular  Filed Weights   07/21/15 2154 07/23/15 2022 07/24/15 2127  Weight: 58.3 kg (128 lb 8.5 oz) 60.827 kg (134 lb 1.6 oz) 60.1 kg (132 lb 7.9 oz)    History of present illness:  Chief Complaint: Blurred vision  HPI: Michele Yu is a 39 y.o. female who presents to ED with CC of diplopia. Symptoms onset this AM when she woke up. Blurred vision in constant. Numbness to sensation in left side of body. 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 No family h/o autoimmune problems. MRI of brain showed demyelinating lesions   Hospital Course:  39 year old [place] American female patient who presented with binocular horizontal diplopia 11/30 with a mild left sixth nerve palsy on exam , had abnormal MRI study with enhancing lesions involving thalamus and brainstem.  She was followed by Neurology CSF labs were abnormal with elevated white count of 160 , with mildly elevated protein.  Clinically her symptoms are completely resolved with high dose of IV Solu-Medrol to 250 mg every 6 hours x5days Could be Related to Viral Encephalitis vs MS -HSV Ab negative, PCR negative - oligoclonal bands not detected - MRI of the cervical spine with and without contrast shows no demyelinating spinal cord lesions (moderately motion degraded but adaquate). Thoracic spine MRI unable to be completed but likely do not need. Awaiting return of send out  remainder of CSF labs.  - I have made a FU with NEurology Dr.Sater in 2 weeks, needs repeat MRI   Procedures:  Lumbar puncture  Consultations:  Neurology  Discharge Exam: Filed Vitals:   07/25/15 0511 07/25/15 0938  BP: 162/75 137/90  Pulse: 47 64  Temp: 98.5 F (36.9 C) 98.7 F (37.1 C)  Resp: 14 16    General: AAOx3 Cardiovascular: S1S2/RRR Respiratory: CTAB  Discharge Instructions   Discharge Instructions    Diet general    Complete by:  As directed      Increase activity slowly    Complete by:  As directed           Current Discharge Medication List    START taking these medications   Details  acetaminophen (TYLENOL) 325 MG tablet Take 2 tablets (650 mg total) by mouth every 6 (six) hours as needed for mild pain or headache.      CONTINUE these medications which have NOT CHANGED   Details  valACYclovir (VALTREX) 1000 MG tablet Take 1,000 mg by mouth 2 (two) times daily.       STOP taking these medications     aspirin 325 MG tablet      butalbital-acetaminophen-caffeine (FIORICET) 50-325-40 MG tablet      traMADol (ULTRAM) 50 MG tablet        No Known Allergies Follow-up Information    Follow up with SATER,RICHARD A, MD On 08/03/2015.   Specialty:  Neurology   Why:  at 9:45am   Contact information:   [address] [place]  [address] [phone]        The results of significant diagnostics from this hospitalization (including imaging, microbiology, ancillary and laboratory) are listed below for reference.    Significant Diagnostic Studies: Ct Head Wo Contrast  07/20/2015  CLINICAL DATA:  39 year old female with headache and blurred vision since this morning. Initial encounter. EXAM: CT HEAD WITHOUT CONTRAST TECHNIQUE: Contiguous axial images were obtained from the base of the skull through the vertex without intravenous contrast. COMPARISON:  None. FINDINGS: Isolated mild left sphenoid sinus opacification. Other Visualized  paranasal sinuses and mastoids are clear. Hyperostosis of the calvarium. No osseous abnormality identified. Dysconjugate gaze, otherwise negative orbit and scalp soft tissues. Cerebral volume is normal. No midline shift, ventriculomegaly, mass effect, evidence of mass lesion, intracranial hemorrhage or evidence of cortically based acute infarction. Gray-white matter differentiation is within normal limits throughout the brain. No suspicious intracranial vascular hyperdensity. IMPRESSION: Normal noncontrast CT appearance of the brain. Electronically Signed   By: Odessa Fleming M.D.   On: 07/20/2015 14:45   Ct Chest W Contrast  07/21/2015  CLINICAL DATA:  39 year old female. Denies CO, SOB or cough. Ordering request evaluation for sarcoidosis. Demyelinating changes in the brain. Denies sx hx to chest/heart/lungs. EXAM: CT CHEST WITH CONTRAST TECHNIQUE: Multidetector CT imaging of the chest was performed during intravenous contrast administration. CONTRAST:  75mL OMNIPAQUE IOHEXOL 300 MG/ML  SOLN COMPARISON:  Chest x-ray 10/02/2013 FINDINGS: Heart: Heart size is normal. No imaged pericardial effusion or significant coronary artery calcifications. Vascular structures: Normal appearance of the thoracic aorta. Pulmonary arteries are normal in appearance. Mediastinum/thyroid: The visualized portion of the thyroid gland has a normal appearance. No retroperitoneal or mesenteric adenopathy. Lungs/Airways: The lungs are clear. No pulmonary nodules or pleural effusions. Airways are patent. Upper abdomen: Unremarkable. Chest wall/osseous structures: No suspicious lytic or blastic lesions are identified. Convex right scoliosis of the thoracic spine. IMPRESSION: 1. Normal appearance of the chest. No changes to indicate sarcoidosis. 2. Scoliosis. Electronically Signed   By: Norva Pavlov M.D.   On: 07/21/2015 17:01   Mr [name] BJ Contrast  07/20/2015  CLINICAL DATA:  Diplopia, onset this morning. EXAM: MRI HEAD WITHOUT AND WITH  CONTRAST TECHNIQUE: Multiplanar, multiecho pulse sequences of the brain and surrounding structures were obtained without and with intravenous contrast. CONTRAST:  10mL MULTIHANCE GADOBENATE DIMEGLUMINE 529 MG/ML IV SOLN COMPARISON:  None. FINDINGS: Calvarium and upper cervical spine: No focal marrow signal abnormality. Orbits: Negative. No enhancement or asymmetric thickening of the optic nerves by brain MRI. Sinuses and Mastoids: Clear. Brain: There is patchy FLAIR hyperintensity within the midbrain, right more than left, around the lower third ventricle, and in the left more than right thalamus. Confluent hazy FLAIR hyperintensity also in deep white matter tracts. These areas show patchy predominately parenchymal enhancement. No predominant leptomeningeal enhancement suggestive of a basilar meningitis. No hydrocephalus or acute infarct. Major intracranial vessels are patent. These results were called by telephone at the time of interpretation on 07/20/2015 at 4:44 pm to Dr. Roxy Horseman , who verbally acknowledged these results. IMPRESSION: Signal abnormality and enhancement in the midbrain, thalami, and deep white matter tracts. A demyelinating process is favored. Distribution is atypical for multiple sclerosis and atypical infection, Bechet's, and sarcoidosis/vasculitis should be considered. Recommend correlation with LP. Electronically Signed   By: Marnee Spring M.D.   On: 07/20/2015 16:49   Mr Cervical Spine Wo Contrast  07/22/2015  CLINICAL DATA:  Horizontal diplopia beginning yesterday, 6 nerve palsy on examination. Skullbase  enhancing lesion seen on MRI of the brain, follow-up evaluation. EXAM: MRI CERVICAL SPINE WITHOUT CONTRAST TECHNIQUE: Multiplanar, multisequence MR imaging of the cervical spine was performed. No intravenous contrast was administered. COMPARISON:  None. FINDINGS: Cervical vertebral bodies and posterior elements are intact and aligned, straightened cervical lordosis on this  moderately motion degraded examination. Intervertebral discs demonstrate normal morphology and signal characteristics. No STIR signal abnormality to suggest acute osseous process. Cervical spinal cord appears normal in morphology and signal characteristics though, patient motion degrades sensitivity for subtle potential signal abnormality. Included prevertebral and paraspinal soft tissues are nonsuspicious. Limited assessment of fluid levels due to patient motion. C2-3, C3-4: No definite disc bulge, canal stenosis or neural foraminal narrowing. C4-5: Small broad-based disc bulge without definite neural foraminal narrowing. Minimal canal stenosis. C5-6: Small broad-based RIGHT central disc protrusion resulting in mild canal stenosis without definite neural foraminal narrowing. C6-7 and C7-T1: No disc bulge, canal stenosis nor neural foraminal narrowing. IMPRESSION: No acute fracture or malalignment on this moderately motion degraded examination. No abnormal signal within the nonenhanced motion degraded cervical spine though, if suspicion persists, recommend repeat evaluation when patient is better able to remain still and, with administration of contrast. Mild canal stenosis at C5-6, minimal at C4-5. No definite neural foraminal narrowing though motion degrades assessment. Electronically Signed   By: Awilda Metro M.D.   On: 07/22/2015 22:26   Mr Thoracic Spine W Wo Contrast  07/22/2015  CLINICAL DATA:  Horizontal diplopia beginning yesterday, 6 nerve palsy on examination. Skullbase enhancing lesion seen on MRI of the brain, follow-up evaluation. EXAM: MRI CERVICAL SPINE WITHOUT CONTRAST TECHNIQUE: Multiplanar, multisequence MR imaging of the cervical spine was performed. No intravenous contrast was administered. COMPARISON:  None. FINDINGS: Cervical vertebral bodies and posterior elements are intact and aligned, straightened cervical lordosis on this moderately motion degraded examination. Intervertebral  discs demonstrate normal morphology and signal characteristics. No STIR signal abnormality to suggest acute osseous process. Cervical spinal cord appears normal in morphology and signal characteristics though, patient motion degrades sensitivity for subtle potential signal abnormality. Included prevertebral and paraspinal soft tissues are nonsuspicious. Limited assessment of fluid levels due to patient motion. C2-3, C3-4: No definite disc bulge, canal stenosis or neural foraminal narrowing. C4-5: Small broad-based disc bulge without definite neural foraminal narrowing. Minimal canal stenosis. C5-6: Small broad-based RIGHT central disc protrusion resulting in mild canal stenosis without definite neural foraminal narrowing. C6-7 and C7-T1: No disc bulge, canal stenosis nor neural foraminal narrowing. IMPRESSION: No acute fracture or malalignment on this moderately motion degraded examination. No abnormal signal within the nonenhanced motion degraded cervical spine though, if suspicion persists, recommend repeat evaluation when patient is better able to remain still and, with administration of contrast. Mild canal stenosis at C5-6, minimal at C4-5. No definite neural foraminal narrowing though motion degrades assessment. Electronically Signed   By: Awilda Metro M.D.   On: 07/22/2015 22:26    Microbiology: Recent Results (from the past 240 hour(s))  CSF culture     Status: None   Collection Time: 07/20/15  7:00 PM  Result Value Ref Range Status   Specimen Description CSF  Final   Special Requests NONE  Final   Gram Stain   Final    WBC PRESENT,BOTH PMN AND MONONUCLEAR NO ORGANISMS SEEN CYTOSPIN    Culture NO GROWTH 3 DAYS  Final   Report Status 07/24/2015 FINAL  Final     Labs: Basic Metabolic Panel:  Recent Labs Lab 07/20/15 1334 07/20/15 1347  NA 137 139  K 3.5 3.7  CL 101 100*  CO2 26  --   GLUCOSE 90 93  BUN 7 8  CREATININE 0.72 0.70  CALCIUM 9.5  --    Liver Function  Tests:  Recent Labs Lab 07/20/15 1334  AST 24  ALT 23  ALKPHOS 75  BILITOT 0.8  PROT 7.5  ALBUMIN 4.1   No results for input(s): LIPASE, AMYLASE in the last 168 hours. No results for input(s): AMMONIA in the last 168 hours. CBC:  Recent Labs Lab 07/20/15 1334 07/20/15 1347  WBC 7.6  --   NEUTROABS 3.8  --   HGB 12.9 14.6  HCT 37.6 43.0  MCV 91.5  --   PLT 277  --    Cardiac Enzymes: No results for input(s): CKTOTAL, CKMB, CKMBINDEX, TROPONINI in the last 168 hours. BNP: BNP (last 3 results) No results for input(s): BNP in the last 8760 hours.  ProBNP (last 3 results) No results for input(s): PROBNP in the last 8760 hours.  CBG: No results for input(s): GLUCAP in the last 168 hours.     SignedZannie Cove  [facility] 07/25/2015, 9:46 AM

## 2015-07-26 LAB — LUPUS ANTICOAGULANT PANEL
DRVVT: 37.2 s (ref 0.0–44.0)
PTT Lupus Anticoagulant: 42.7 s — ABNORMAL HIGH (ref 0.0–40.6)

## 2015-07-26 LAB — PTT-LA INCUB MIX: PTT-LA Incub Mix: 48.9 s — ABNORMAL HIGH (ref 0.0–40.6)

## 2015-07-26 LAB — PTT-LA MIX: PTT-LA Mix: 40.3 s (ref 0.0–40.6)

## 2015-07-26 LAB — ANGIOTENSIN CONVERTING ENZYME, CSF: Angio Convert Enzyme: 0.4 U/L (ref 0.0–2.5)

## 2015-07-26 LAB — HEXAGONAL PHASE PHOSPHOLIPID: Hexagonal Phase Phospholipid: 6 s (ref 0–11)

## 2015-08-03 ENCOUNTER — Ambulatory Visit (INDEPENDENT_AMBULATORY_CARE_PROVIDER_SITE_OTHER): Payer: BLUE CROSS/BLUE SHIELD | Admitting: Neurology

## 2015-08-03 ENCOUNTER — Encounter: Payer: Self-pay | Admitting: Neurology

## 2015-08-03 VITALS — BP 110/74 | HR 82 | Resp 14 | Ht 64.0 in | Wt 133.2 lb

## 2015-08-03 DIAGNOSIS — R839 Unspecified abnormal finding in cerebrospinal fluid: Secondary | ICD-10-CM

## 2015-08-03 DIAGNOSIS — G379 Demyelinating disease of central nervous system, unspecified: Secondary | ICD-10-CM | POA: Diagnosis not present

## 2015-08-03 DIAGNOSIS — R930 Abnormal findings on diagnostic imaging of skull and head, not elsewhere classified: Secondary | ICD-10-CM

## 2015-08-03 DIAGNOSIS — H532 Diplopia: Secondary | ICD-10-CM | POA: Diagnosis not present

## 2015-08-03 DIAGNOSIS — R9089 Other abnormal findings on diagnostic imaging of central nervous system: Secondary | ICD-10-CM
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
## 2015-08-04 ENCOUNTER — Telehealth: Payer: Self-pay | Admitting: *Deleted

## 2015-08-04 NOTE — Telephone Encounter (Signed)
LMTC, both #'s/fim

## 2015-08-04 NOTE — Telephone Encounter (Signed)
-----   Message from Richard A Sater, MD sent at 08/03/2015  6:17 PM EST ----- Brandis Matsuura, After I discharged her yesterday, I decided to check a couple of lab tests. I put the orders in. Continue ask her to come back to this office either later this week or next week to get the blood work done.  Thank you 

## 2015-08-10 ENCOUNTER — Telehealth: Payer: Self-pay | Admitting: *Deleted

## 2015-08-10 NOTE — Telephone Encounter (Signed)
Received message that "at the prescriber's request, this phone does not receive incoming calls."  I have lmom mobile # for pt. to call the office/fim

## 2015-08-10 NOTE — Telephone Encounter (Signed)
-----   Message from Asa Lente, MD sent at 08/03/2015  6:17 PM EST ----- Mylinda Brook, After I discharged her yesterday, I decided to check a couple of lab tests. I put the orders in. Continue ask her to come back to this office either later this week or next week to get the blood work done.  Thank you

## 2015-08-26 ENCOUNTER — Other Ambulatory Visit: Payer: Self-pay | Admitting: *Deleted

## 2015-08-26 ENCOUNTER — Telehealth: Payer: Self-pay | Admitting: Neurology

## 2015-08-26 ENCOUNTER — Inpatient Hospital Stay: Admission: RE | Admit: 2015-08-26 | Payer: BC Managed Care – PPO | Source: Ambulatory Visit

## 2015-08-26 MED ORDER — ALPRAZOLAM 0.5 MG PO TABS: ORAL_TABLET | ORAL | Status: DC

## 2015-08-26 NOTE — Telephone Encounter (Signed)
Rx. for Alprazolam 0.5 # 2 faxed to [facility] [address] per pt's request/fim

## 2015-08-26 NOTE — Telephone Encounter (Signed)
Patient is calling. She is having an MRI at 6pm today and needs a Rx called in to calm her down before the procedure. Please call to [facility] Thank you.

## 2015-09-03 ENCOUNTER — Ambulatory Visit
Admission: RE | Admit: 2015-09-03 | Discharge: 2015-09-03 | Disposition: A | Discharge status: Home / Self Care | Payer: BLUE CROSS/BLUE SHIELD | Source: Ambulatory Visit | Attending: Neurology | Admitting: Neurology

## 2015-09-03 DIAGNOSIS — R839 Unspecified abnormal finding in cerebrospinal fluid: Secondary | ICD-10-CM

## 2015-09-03 DIAGNOSIS — H532 Diplopia: Secondary | ICD-10-CM | POA: Diagnosis not present

## 2015-09-03 DIAGNOSIS — G379 Demyelinating disease of central nervous system, unspecified: Secondary | ICD-10-CM

## 2015-09-03 DIAGNOSIS — R930 Abnormal findings on diagnostic imaging of skull and head, not elsewhere classified: Secondary | ICD-10-CM | POA: Diagnosis not present

## 2015-09-03 DIAGNOSIS — R9089 Other abnormal findings on diagnostic imaging of central nervous system: Secondary | ICD-10-CM

## 2015-09-03 MED ORDER — GADOBENATE DIMEGLUMINE 529 MG/ML IV SOLN
12.0000 mL | Freq: Once | INTRAVENOUS | Status: AC | PRN
  Administered 2015-09-03: 12 mL via INTRAVENOUS

## 2015-09-05 ENCOUNTER — Telehealth: Payer: Self-pay | Admitting: *Deleted

## 2015-09-05 NOTE — Telephone Encounter (Signed)
-----   Message from Richard A Sater, MD sent at 08/03/2015  6:17 PM EST ----- Michele Yu, After I discharged her yesterday, I decided to check a couple of lab tests. I put the orders in. Continue ask her to come back to this office either later this week or next week to get the blood work done.  Thank you 

## 2015-09-05 NOTE — Telephone Encounter (Signed)
I have spoken with Duhe will come in for additional labs/fim

## 2015-09-05 NOTE — Telephone Encounter (Signed)
I have spoken with Michele Yu and per RAS, advised that mri brain looks better than the one she had in the hospital a couple of mos. ago.  Nothing looks new.  She verbalized understanding of same/fim

## 2015-09-05 NOTE — Telephone Encounter (Signed)
-----   Message from Asa Lente, MD sent at 09/04/2015  5:00 PM EST ----- Please let her know that the MRI of the brain looks better than the one she had a couple months ago in the hospital.   Nothing looks new.

## 2015-11-23 ENCOUNTER — Encounter: Payer: Self-pay | Admitting: Neurology

## 2015-11-23 ENCOUNTER — Ambulatory Visit (INDEPENDENT_AMBULATORY_CARE_PROVIDER_SITE_OTHER): Payer: BLUE CROSS/BLUE SHIELD | Admitting: Neurology

## 2015-11-23 VITALS — BP 116/80 | HR 78 | Resp 14

## 2015-11-23 DIAGNOSIS — H532 Diplopia: Secondary | ICD-10-CM

## 2015-11-23 DIAGNOSIS — R839 Unspecified abnormal finding in cerebrospinal fluid: Secondary | ICD-10-CM | POA: Diagnosis not present

## 2015-11-23 DIAGNOSIS — R9089 Other abnormal findings on diagnostic imaging of central nervous system: Secondary | ICD-10-CM

## 2015-11-23 DIAGNOSIS — R930 Abnormal findings on diagnostic imaging of skull and head, not elsewhere classified: Secondary | ICD-10-CM

## 2015-11-23 NOTE — Progress Notes (Signed)
[facility]  PATIENT: Michele Yu DOB: 21-Jan-1976  REFERRING DOCTOR OR PCP:  Phillips Odor, MD SOURCE: patient, EMR records, MRI images on PACS  _________________________________   HISTORICAL  CHIEF COMPLAINT:  Chief Complaint  Patient presents with  . Abnormal MRI    She denies further episodes of right sided facial numbness or diplopia.  To discuss results of mri brain done 09-04-15./fim  . Facial Numbness  . Diplopia    HISTORY OF PRESENT ILLNESS:  Michele Yu is a 40 year old woman who had diplopia and right facial numbness in December 2016 and was found to have an abnormal MRI showing a focus in the right pons that had patchy enhancement and scattered T2/FLAIR changes elsewhere with some patchy enhancement.    She received steroids and was sent to me for follow-up. We repeated the MRI of the brain 09/03/2015.    I reviewed it in her presence and compared it to the December 2016 MRI. The brain stem lesion is essentially resolved though some of the other foci were still present. There was no abnormal enhancement on the follow-up MRI.  Currently, she denies any difficulties with weakness, numbness or gait difficulties. She does not note any double vision or other visual problems. She notes no urinary / bladder changes.  Her right knee has had some aching over the past week.    She denies any new numbness or weakness  She had multiple labs and a lumbar puncture at [facility].  It showed a pleocytosis that was lymphocyte predominant there were no oligoclonal bands.   At the hospital the Lupus anticoagulant was mildly abnormal Additionally I checked ANA, ANCA and they were normal.      History of diplopia/numbness:    She presented with diplopia and right facial numbness in early December.  She was admitted  and received 5 days of IV steroids. While she was in the hospital, she did have urinary incontinence.  She had one or 2 more episodes after she was  discharged. She did not note any major cognitive dysfunction while she was in the hospital. However, she thinks that she was a little bit slowed in her thoughts.  While there, she had an MRI of the brain that showed a white matter lesion in the right side of the mid brain to the adjacent thalamus and also some patchy white matter changes in the hemispheres. There was patchy enhancement of some of these foci.   MRI of the cervical spinal cord did not show any demyelinating lesions. She did have mild degenerative changes that were unlikely to be playing a role. I reviewed these MRIs and concur with the official interpretation. A lumbar puncture was performed. The cerebrospinal fluid did not show elevated IgG nor oligoclonal bands. However, she did have increased number of white blood cells (175), with a pleocytosis with lymphocytes > neutrophils.     By the time she was discharged, the facial numbness had resolved and the diplopia was almost completely resolved.    REVIEW OF SYSTEMS: Constitutional: No fevers, chills, sweats, or change in appetite Eyes: No visual changes, double vision, eye pain Ear, nose and throat: No hearing loss, ear pain, nasal congestion, sore throat Cardiovascular: No chest pain, palpitations Respiratory: No shortness of breath at rest or with exertion.   No wheezes GastrointestinaI: No nausea, vomiting, diarrhea, abdominal pain, fecal incontinence Genitourinary: No dysuria, urinary retention or frequency.  No nocturia. Musculoskeletal: No neck pain, back pain Integumentary: No rash, pruritus, skin  lesions Neurological: as above uPsychiatric: No depression at this time.  No anxiety Endocrine: No palpitations, diaphoresis, change in appetite, change in weigh or increased thirst Hematologic/Lymphatic: No anemia, purpura, petechiae. Allergic/Immunologic: No itchy/runny eyes, nasal congestion, recent allergic reactions, rashes  ALLERGIES: No Known Allergies  HOME  MEDICATIONS:  Current outpatient prescriptions:  .  acetaminophen (TYLENOL) 325 MG tablet, Take 2 tablets (650 mg total) by mouth every 6 (six) hours as needed for mild pain or headache., Disp: , Rfl:  .  valACYclovir (VALTREX) 1000 MG tablet, Take 1,000 mg by mouth 2 (two) times daily. , Disp: , Rfl:  .  ALPRAZolam (XANAX) 0.5 MG tablet, Take one tablet 30 minutes prior to mri.  Take the other tablet with you to your appointment and repeat if needed.  You must not drive or drink alcohol while taking Alprazolam. (Patient not taking: Reported on 11/23/2015), Disp: 2 tablet, Rfl: 0  PAST MEDICAL HISTORY: Past Medical History  Diagnosis Date  . Genital HSV   . Hypertension 2003    "just right after I had my 1st baby"  . Stomach ulcer   . Daily headache   . Anxiety   . Depression   . Panic attacks     PAST SURGICAL HISTORY: Past Surgical History  Procedure Laterality Date  . Appendectomy  1992  . Dilation and curettage of uterus      S/P "miscarriage"    FAMILY HISTORY: Family History  Problem Relation Age of Onset  . Heart attack Father     SOCIAL HISTORY:  Social History   Social History  . Marital Status: Married    Spouse Name: N/A  . Number of Children: N/A  . Years of Education: N/A   Occupational History  . Not on file.   Social History Main Topics  . Smoking status: Former Smoker -- 1.00 packs/day for 19 years    Types: Cigarettes    Quit date: 01/19/2015  . Smokeless tobacco: Never Used  . Alcohol Use: 8.4 oz/week    14 Glasses of wine per week     Comment: 08-03-15--sts. drinks 3-4 beers daily.  . Drug Use: Yes    Special: Marijuana     Comment: 07/20/2015 "I've experimented w/marijuana"  . Sexual Activity: Yes    Birth Control/ Protection: Condom, Spermicide   Other Topics Concern  . Not on file   Social History Narrative     PHYSICAL EXAM  Filed Vitals:   11/23/15 0910  BP: 116/80  Pulse: 78  Resp: 14    There is no weight on file to  calculate BMI.   General: The patient is well-developed and well-nourished and in no acute distress  Neurologic Exam  Mental status: The patient is alert and oriented x 3 at the time of the examination. The patient has apparent normal recent and remote memory, with an apparently normal attention span and concentration ability.   Speech is normal.  Cranial nerves: Extraocular movements are full. Pupils are equal, round, and reactive to light and accomodation.  Visual fields are full.  Facial symmetry is present. There is good facial sensation to soft touch bilaterally.Facial strength is normal.  Trapezius and sternocleidomastoid strength is normal. No dysarthria is noted.  The tongue is midline, and the patient has symmetric elevation of the soft palate. No obvious hearing deficits are noted.  Motor:  Muscle bulk is normal.   Tone is normal. Strength is  5 / 5 in all 4 extremities.   Sensory:  Sensory testing is intact to touch and vibration sensation in all 4 extremities.  Coordination: Cerebellar testing reveals good finger-nose-finger bilaterally.  Gait and station: Station is normal.   Gait is normal. Tandem gait is normal. Romberg is negative.   Reflexes: Deep tendon reflexes are symmetric and normal bilaterally in arms and brisk in legs      DIAGNOSTIC DATA (LABS, IMAGING, TESTING) - I reviewed patient records, labs, notes, testing and imaging myself where available.  Lab Results  Component Value Date   WBC 7.6 07/20/2015   HGB 14.6 07/20/2015   HCT 43.0 07/20/2015   MCV 91.5 07/20/2015   PLT 277 07/20/2015      Component Value Date/Time   NA 139 07/20/2015 1347   K 3.7 07/20/2015 1347   CL 100* 07/20/2015 1347   CO2 26 07/20/2015 1334   GLUCOSE 93 07/20/2015 1347   BUN 8 07/20/2015 1347   CREATININE 0.70 07/20/2015 1347   CALCIUM 9.5 07/20/2015 1334   PROT 7.5 07/20/2015 1334   ALBUMIN 4.1 07/20/2015 1334   AST 24 07/20/2015 1334   ALT 23 07/20/2015 1334   ALKPHOS  75 07/20/2015 1334   BILITOT 0.8 07/20/2015 1334   GFRNONAA >60 07/20/2015 1334   GFRAA >60 07/20/2015 1334       ASSESSMENT AND PLAN  Abnormal brain MRI  Abnormal finding in CSF  Diplopia    1.  She will return to see me in 4 months or call sooner if there are new or worsening neurologic symptoms. 2.  Around the time of that visit we will check another MRI to determine if there has been development of any new lesions. Also consider rechecking the lupus anticoagulant at that time. 3.   I reviewed a list of symptoms that she should call us for if they develop.   Richard A. Epimenio Foot, MD, PhD 11/23/2015, 9:18 AM Certified in Neurology, Clinical Neurophysiology, Sleep Medicine, Pain Medicine and Neuroimaging  [facility] [address], [address] [place], [place] [address] [phone]

## 2015-12-22 ENCOUNTER — Telehealth: Payer: Self-pay | Admitting: Neurology

## 2015-12-22 ENCOUNTER — Encounter (HOSPITAL_COMMUNITY): Payer: Self-pay | Admitting: Emergency Medicine

## 2015-12-22 ENCOUNTER — Emergency Department (HOSPITAL_COMMUNITY)
Admission: EM | Admit: 2015-12-22 | Discharge: 2015-12-22 | Disposition: A | Discharge status: Home / Self Care | Payer: BLUE CROSS/BLUE SHIELD | Attending: Emergency Medicine | Admitting: Emergency Medicine

## 2015-12-22 DIAGNOSIS — H538 Other visual disturbances: Secondary | ICD-10-CM | POA: Diagnosis not present

## 2015-12-22 DIAGNOSIS — R634 Abnormal weight loss: Secondary | ICD-10-CM

## 2015-12-22 DIAGNOSIS — Z87891 Personal history of nicotine dependence: Secondary | ICD-10-CM | POA: Insufficient documentation

## 2015-12-22 DIAGNOSIS — F329 Major depressive disorder, single episode, unspecified: Secondary | ICD-10-CM | POA: Diagnosis not present

## 2015-12-22 DIAGNOSIS — I10 Essential (primary) hypertension: Secondary | ICD-10-CM | POA: Insufficient documentation

## 2015-12-22 DIAGNOSIS — G43019 Migraine without aura, intractable, without status migrainosus: Secondary | ICD-10-CM | POA: Insufficient documentation

## 2015-12-22 DIAGNOSIS — Z79899 Other long term (current) drug therapy: Secondary | ICD-10-CM | POA: Diagnosis not present

## 2015-12-22 DIAGNOSIS — Z7982 Long term (current) use of aspirin: Secondary | ICD-10-CM | POA: Diagnosis not present

## 2015-12-22 LAB — CBC WITH DIFFERENTIAL/PLATELET
Basophils Absolute: 0 10*3/uL (ref 0.0–0.1)
Basophils Relative: 1 %
EOS PCT: 0 %
Eosinophils Absolute: 0 10*3/uL (ref 0.0–0.7)
HEMATOCRIT: 36.6 % (ref 36.0–46.0)
HEMOGLOBIN: 12.6 g/dL (ref 12.0–15.0)
LYMPHS ABS: 1.4 10*3/uL (ref 0.7–4.0)
LYMPHS PCT: 27 %
MCH: 30.9 pg (ref 26.0–34.0)
MCHC: 34.4 g/dL (ref 30.0–36.0)
MCV: 89.7 fL (ref 78.0–100.0)
Monocytes Absolute: 0.2 10*3/uL (ref 0.1–1.0)
Monocytes Relative: 4 %
NEUTROS ABS: 3.7 10*3/uL (ref 1.7–7.7)
Neutrophils Relative %: 68 %
PLATELETS: 347 10*3/uL (ref 150–400)
RBC: 4.08 MIL/uL (ref 3.87–5.11)
RDW: 13.8 % (ref 11.5–15.5)
WBC: 5.4 10*3/uL (ref 4.0–10.5)

## 2015-12-22 LAB — URINALYSIS, ROUTINE W REFLEX MICROSCOPIC
Bilirubin Urine: NEGATIVE
Glucose, UA: NEGATIVE mg/dL
HGB URINE DIPSTICK: NEGATIVE
Ketones, ur: 15 mg/dL — AB
Leukocytes, UA: NEGATIVE
Nitrite: NEGATIVE
Protein, ur: NEGATIVE mg/dL
Specific Gravity, Urine: 1.03 (ref 1.005–1.030)
pH: 5.5 (ref 5.0–8.0)

## 2015-12-22 LAB — COMPREHENSIVE METABOLIC PANEL
ALT: 10 U/L — ABNORMAL LOW (ref 14–54)
ANION GAP: 9 (ref 5–15)
AST: 17 U/L (ref 15–41)
Albumin: 4.1 g/dL (ref 3.5–5.0)
Alkaline Phosphatase: 76 U/L (ref 38–126)
BUN: 19 mg/dL (ref 6–20)
CHLORIDE: 98 mmol/L — AB (ref 101–111)
CO2: 29 mmol/L (ref 22–32)
CREATININE: 0.75 mg/dL (ref 0.44–1.00)
Calcium: 9.7 mg/dL (ref 8.9–10.3)
Glucose, Bld: 189 mg/dL — ABNORMAL HIGH (ref 65–99)
POTASSIUM: 4 mmol/L (ref 3.5–5.1)
SODIUM: 136 mmol/L (ref 135–145)
Total Bilirubin: 0.6 mg/dL (ref 0.3–1.2)
Total Protein: 8.7 g/dL — ABNORMAL HIGH (ref 6.5–8.1)

## 2015-12-22 LAB — T4, FREE: FREE T4: 0.78 ng/dL (ref 0.61–1.12)

## 2015-12-22 LAB — PHOSPHORUS: PHOSPHORUS: 3.1 mg/dL (ref 2.5–4.6)

## 2015-12-22 LAB — MAGNESIUM: MAGNESIUM: 2.2 mg/dL (ref 1.7–2.4)

## 2015-12-22 LAB — TSH: TSH: 0.654 u[IU]/mL (ref 0.350–4.500)

## 2015-12-22 MED ORDER — SODIUM CHLORIDE 0.9 % IV BOLUS (SEPSIS)
1000.0000 mL | Freq: Once | INTRAVENOUS | Status: AC
  Administered 2015-12-22: 1000 mL via INTRAVENOUS

## 2015-12-22 MED ORDER — DIPHENHYDRAMINE HCL 50 MG/ML IJ SOLN
25.0000 mg | Freq: Once | INTRAMUSCULAR | Status: AC
  Administered 2015-12-22: 25 mg via INTRAVENOUS
  Filled 2015-12-22: qty 1

## 2015-12-22 MED ORDER — DEXAMETHASONE SODIUM PHOSPHATE 10 MG/ML IJ SOLN
10.0000 mg | Freq: Once | INTRAMUSCULAR | Status: AC
  Administered 2015-12-22: 10 mg via INTRAVENOUS
  Filled 2015-12-22: qty 1

## 2015-12-22 MED ORDER — METOCLOPRAMIDE HCL 5 MG/ML IJ SOLN
10.0000 mg | Freq: Once | INTRAMUSCULAR | Status: AC
  Administered 2015-12-22: 10 mg via INTRAVENOUS
  Filled 2015-12-22: qty 2

## 2015-12-22 NOTE — Telephone Encounter (Signed)
FYI-Dr. Alphonzo Lemmings [facility] [phone] called to request 03/26/16 appointment be moved up ASAP, patient has worsening symptoms. Appointment rescheduled to tomorrow, Friday May 5th 8:40am, advised patient needs to arrive 15 minutes early and to bring ins card, picture ID and copay.

## 2015-12-22 NOTE — Progress Notes (Signed)
Pt confirmed with ED Cm she has an OB Gyn and a neurologist but no pcp Cm offered pt a 5 page list of [employer] value providers within her [place] code [id number] and spoke with pt on how to obtain an in network pcp with insurance coverage via the customer service number or web site  Cm reviewed ED level of care for crisis/emergent services and community pcp level of care to manage continuous or chronic medical concerns.  The pt voiced understanding CM encouraged pt and discussed pt's responsibility to verify with pt's insurance carrier that any recommended medical provider offered by any emergency room or a hospital provider is within the carrier's network. The pt voiced understanding

## 2015-12-22 NOTE — ED Provider Notes (Addendum)
CSN: [id number]     Arrival date & time 12/22/15  0820 History   First MD Initiated Contact with Patient 12/22/15 [phone]     Chief Complaint  Patient presents with  . Weight Loss     (Consider location/radiation/quality/duration/timing/severity/associated sxs/prior Treatment) HPI Comments: Patient is a 40 year old female with a history of chronic headaches, depression and anxiety presenting today with multiple complaints. Patient states approximately 3-4 weeks ago she had a viral illness with vomiting and diarrhea that lasted approximately 3 days. 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 However since that is resolved she has had no appetite. She states she can go days without eating just because she does not feel hungry. She denies any further vomiting, nausea, abdominal pain or diarrhea. She has had no fevers. She denies any new medications and has not had any tobacco, marijuana or alcohol in months. Over the last 1 month she's lost approximately 30 pounds.  She denies any urinary symptoms, chest pain, shortness of breath, palpitations. Secondly patient was admitted to the hospital at the end of March for unilateral numbness, weakness and diplopia. At that time she was found to have an abnormal MRI of the brain and C-spine. Lesions were present which at this point are undifferentiated. She had an LP that had lymphocytes but no oligoclonal bands. She had a weakly positive ANA but the rest of her labs were within normal limits. Based on the neurologist they may repeat the studies but no definitive lupus was found. Patient states since that time she continues to have severe migraines which she uses over-the-counter products 4. The diplopia has resolved and the blurred vision in the right eye has improved but she continues to have blurred vision in her left eye which is not improving. She has not spoken with her neurologist about this since seeing them at the beginning of  April. She was scheduled for a 3 month repeat visit. While hospitalized she had multiple labs checked including an HIV which was negative.  The history is provided by the patient.    Past Medical History  Diagnosis Date  . Genital HSV   . Hypertension 2003    "just right after I had my 1st baby"  . Stomach ulcer   . Daily headache   . Anxiety   . Depression   . Panic attacks    Past Surgical History  Procedure Laterality Date  . Appendectomy  1992  . Dilation and curettage of uterus      S/P "miscarriage"   Family History  Problem Relation Age of Onset  . Heart attack Father    Social History  Substance Use Topics  . Smoking status: Former Smoker -- 1.00 packs/day for 19 years    Types: Cigarettes    Quit date: 01/19/2015  . Smokeless tobacco: Never Used  . Alcohol Use: 8.4 oz/week    14 Glasses of wine per week     Comment: 08-03-15--sts. drinks 3-4 beers daily.   OB History    Gravida Para Term Preterm AB TAB SAB Ectopic Multiple Living   6 2   4 3  1  2      Review of Systems  All other systems reviewed and are negative.     Allergies  Review of patient's allergies indicates no known allergies.  Home Medications   Prior to Admission medications   Medication Sig Start Date End Date Taking? Authorizing Provider  aspirin-acetaminophen-caffeine (MIGRAINE FORMULA) [phone] MG  tablet Take 2 tablets by mouth every 6 (six) hours as needed for headache or migraine.   Yes Historical Provider, MD  etonogestrel (NEXPLANON) 68 MG IMPL implant 1 each by Subdermal route once. Placed 11/02/2013   Yes Historical Provider, MD  valACYclovir (VALTREX) 500 MG tablet Take 500 mg by mouth daily.   Yes Historical Provider, MD  acetaminophen (TYLENOL) 325 MG tablet Take 2 tablets (650 mg total) by mouth every 6 (six) hours as needed for mild pain or headache. Patient not taking: Reported on 12/22/2015 07/25/15   Zannie Cove, MD  ALPRAZolam Prudy Feeler) 0.5 MG tablet Take one tablet  30 minutes prior to mri.  Take the other tablet with you to your appointment and repeat if needed.  You must not drive or drink alcohol while taking Alprazolam. Patient not taking: Reported on 11/23/2015 08/26/15   Asa Lente, MD   BP 114/76 mmHg  Pulse 95  Temp(Src) 97.5 F (36.4 C) (Oral)  Resp 17  SpO2 100% Physical Exam  Constitutional: She is oriented to person, place, and time. She appears well-developed and well-nourished. No distress.  thin  HENT:  Head: Normocephalic and atraumatic.  Mouth/Throat: Oropharynx is clear and moist. Mucous membranes are dry.  Eyes: Conjunctivae and EOM are normal. Pupils are equal, round, and reactive to light.  Neck: Normal range of motion. Neck supple. No thyromegaly present.  Cardiovascular: Regular rhythm and intact distal pulses.  Tachycardia present.   No murmur heard. Pulmonary/Chest: Effort normal and breath sounds normal. No respiratory distress. She has no wheezes. She has no rales.  Abdominal: Soft. She exhibits no distension. There is no tenderness. There is no rebound and no guarding.  Musculoskeletal: Normal range of motion. She exhibits no edema or tenderness.  Neurological: She is alert and oriented to person, place, and time.  Skin: Skin is warm and dry. No rash noted. No erythema.  Psychiatric: She has a normal mood and affect. Her behavior is normal.  Nursing note and vitals reviewed.   ED Course  Procedures (including critical care time) Labs Review Labs Reviewed  COMPREHENSIVE METABOLIC PANEL - Abnormal; Notable for the following:    Chloride 98 (*)    Glucose, Bld 189 (*)    Total Protein 8.7 (*)    ALT 10 (*)    All other components within normal limits  URINALYSIS, ROUTINE W REFLEX MICROSCOPIC (NOT AT [facility]) - Abnormal; Notable for the following:    APPearance CLOUDY (*)    Ketones, ur 15 (*)    All other components within normal limits  MAGNESIUM  PHOSPHORUS  TSH  CBC WITH DIFFERENTIAL/PLATELET  T4, FREE     Imaging Review No results found. I have personally reviewed and evaluated these images and lab results as part of my medical decision-making.   EKG Interpretation None      MDM   Final diagnoses:  Intractable migraine without aura and without status migrainosus  Weight loss  Blurry vision, left eye    Patient is a 40 year old female with a history of depression and anxiety presenting today with multiple complaints. She was seen at the beginning of April by neurology after a hospital stay where she had unilateral weakness and diplopia. She had an abnormal MRI of the head and C-spine showing lesions concerning for MS. However she had an LP that was positive for only lymphocytes but no oligoclonal bands. This is not a definitive diagnosis made but she is to follow-up with the neurologist. She states that  she continues to have migraine headaches and she no longer has diplopia and the blurry vision in the right eye has improved however she is continuing to have blurry vision in the left eye. 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 She states she is not eating she has no appetite but denies any further vomiting, diarrhea or abdominal pain. She takes no medications at this time except for having the birth control Nexplanon.  Patient's family is convinced that it's the birth control that's causing these problems however patient has had the implant for greater than 2 years. Is been no new medication changes. She since has stopped using all tobacco and alcohol products and has not used any marijuana in some time.  On exam patient is a thin appearing but otherwise has a normal exam except for some borderline tachycardia.  Normal neurologic exam however will perform a visual  acuity.  Concern for possible diabetes versus thyroid disease versus lingering symptoms after a viral syndrome as a potential cause for her weight loss and decreased appetite. Low suspicion for HIV as patient was just tested within the last 30 days and it was negative. CBC, CMP, phosphorus, magnesium, TSH, T4 pending.  11:17 AM Labs wnl except for BS of 189 but drinking mountain dew. Visual acuity 20/20.  VS remain stable.  After headache cocktail pt's HA has resolved.  Spoke with Dr. Bonnita [facility] office and got pt an appointment for 8:25 tomorrow. Also encouraged pt to f/u with gyn for nexplanon removal.  Gwyneth Sprout, MD 12/22/15 1121  Gwyneth Sprout, MD 12/22/15 1123

## 2015-12-22 NOTE — Telephone Encounter (Signed)
noted/fim 

## 2015-12-22 NOTE — ED Notes (Addendum)
Pt reports fever on Monday, migraine headaches, and anorexia onset last year, with weight loss of 30 pounds this past month. Pt attributes symptoms to Explanon implanted contraceptive device inserted 11/02/13, which she wants removed. Patient's main concern is weight loss.

## 2015-12-22 NOTE — Discharge Instructions (Signed)
Blurred Vision Having blurred vision means that you cannot see things clearly. Your vision may seem fuzzy or out of focus. Blurred vision is a very common symptom of an eye or vision problem. Blurred vision is often a gradual blur that occurs in one eye or both eyes. There are many causes of blurred vision, including cataracts, macular degeneration, and diabetic retinopathy. Blurred vision can be diagnosed based on your symptoms and a physical exam. Tell your health care provider about any other health problems you have, any recent eye injury, and any prior surgeries. You may need to see a health care provider who specializes in eye problems (ophthalmologist). Your treatment depends on what is causing your blurred vision.  HOME CARE INSTRUCTIONS  Tell your health care provider about any changes in your blurred vision.  Do not drive or operate heavy machinery if your vision is blurry.  Keep all follow-up visits as directed by your health care provider. This is important. SEEK MEDICAL CARE IF:  Your symptoms get worse.  You have new symptoms.  You have trouble seeing at night.  You have trouble seeing up close or far away.  You have trouble noticing the difference between colors. SEEK IMMEDIATE MEDICAL CARE IF:  You have severe eye pain.  You have a severe headache.  You have flashing lights in your field of vision.  You have a sudden change in vision.  You have a sudden loss of vision.  You have vision change after an injury.  You notice drainage coming from your eyes.  You notice a rash around your eyes.   This information is not intended to replace advice given to you by your health care provider. Make sure you discuss any questions you have with your health care provider.   Document Released: 08/09/2003 Document Revised: 12/21/2014 Document Reviewed: 06/30/2014 Elsevier Interactive Patient Education 2016 [employer].  Migraine Headache A migraine headache is an  intense, throbbing pain on one or both sides of your head. A migraine can last for 30 minutes to several hours. CAUSES  The exact cause of a migraine headache is not always known. However, a migraine may be caused when nerves in the brain become irritated and release chemicals that cause inflammation. This causes pain. Certain things may also trigger migraines, such as:  Alcohol.  Smoking.  Stress.  Menstruation.  Aged cheeses.  Foods or drinks that contain nitrates, glutamate, aspartame, or tyramine.  Lack of sleep.  Chocolate.  Caffeine.  Hunger.  Physical exertion.  Fatigue.  Medicines used to treat chest pain (nitroglycerine), birth control pills, estrogen, and some blood pressure medicines. SIGNS AND SYMPTOMS  Pain on one or both sides of your head.  Pulsating or throbbing pain.  Severe pain that prevents daily activities.  Pain that is aggravated by any physical activity.  Nausea, vomiting, or both.  Dizziness.  Pain with exposure to bright lights, loud noises, or activity.  General sensitivity to bright lights, loud noises, or smells. Before you get a migraine, you may get warning signs that a migraine is coming (aura). An aura may include:  Seeing flashing lights.  Seeing bright spots, halos, or zigzag lines.  Having tunnel vision or blurred vision.  Having feelings of numbness or tingling.  Having trouble talking.  Having muscle weakness. DIAGNOSIS  A migraine headache is often diagnosed based on:  Symptoms.  Physical exam.  A CT scan or MRI of your head. These imaging tests cannot diagnose migraines, but they can help rule  out other causes of headaches. TREATMENT Medicines may be given for pain and nausea. Medicines can also be given to help prevent recurrent migraines.  HOME CARE INSTRUCTIONS  Only take over-the-counter or prescription medicines for pain or discomfort as directed by your health care provider. The use of long-term  narcotics is not recommended.  Lie down in a dark, quiet room when you have a migraine.  Keep a journal to find out what may trigger your migraine headaches. For example, write down:  What you eat and drink.  How much sleep you get.  Any change to your diet or medicines.  Limit alcohol consumption.  Quit smoking if you smoke.  Get 7-9 hours of sleep, or as recommended by your health care provider.  Limit stress.  Keep lights dim if bright lights bother you and make your migraines worse. SEEK IMMEDIATE MEDICAL CARE IF:   Your migraine becomes severe.  You have a fever.  You have a stiff neck.  You have vision loss.  You have muscular weakness or loss of muscle control.  You start losing your balance or have trouble walking.  You feel faint or pass out.  You have severe symptoms that are different from your first symptoms. MAKE SURE YOU:   Understand these instructions.  Will watch your condition.  Will get help right away if you are not doing well or get worse.   This information is not intended to replace advice given to you by your health care provider. Make sure you discuss any questions you have with your health care provider.   Document Released: 08/06/2005 Document Revised: 08/27/2014 Document Reviewed: 04/13/2013 Elsevier Interactive Patient Education [employer]

## 2015-12-23 ENCOUNTER — Encounter: Payer: Self-pay | Admitting: Neurology

## 2015-12-23 ENCOUNTER — Ambulatory Visit (INDEPENDENT_AMBULATORY_CARE_PROVIDER_SITE_OTHER): Payer: BLUE CROSS/BLUE SHIELD | Admitting: Neurology

## 2015-12-23 VITALS — BP 106/66 | HR 78 | Resp 16 | Ht 64.0 in | Wt 106.5 lb

## 2015-12-23 DIAGNOSIS — G43709 Chronic migraine without aura, not intractable, without status migrainosus: Secondary | ICD-10-CM

## 2015-12-23 DIAGNOSIS — R260 Ataxic gait: Secondary | ICD-10-CM | POA: Diagnosis not present

## 2015-12-23 DIAGNOSIS — R634 Abnormal weight loss: Secondary | ICD-10-CM | POA: Diagnosis not present

## 2015-12-23 MED ORDER — DIVALPROEX SODIUM 500 MG PO DR TAB
500.0000 mg | DELAYED_RELEASE_TABLET | Freq: Every day | ORAL | Status: DC
Start: 2015-12-23 — End: 2016-10-23

## 2015-12-23 NOTE — Progress Notes (Signed)
[facility]  PATIENT: Michele Yu DOB: October 06, 1975  REFERRING DOCTOR OR PCP:  Ellison Carwin, MD SOURCE: patient, EMR records, MRI images on PACS  _________________________________   HISTORICAL  CHIEF COMPLAINT:  Chief Complaint  Patient presents with  . Abnormal MRI [facility] follow up--was seen in [place] ER yesterday for h/a that had been present for 2 days, gait/balance disturbance, intermittent blurry vision, and wt. loss.  Since first visit here, sts. diplopia and facial numbness have completely resolved. Visual acuity today is 20/20 bilat without corrective lenses.[place]  . Diplopia  . Facial Numbness  . Headache  . Weight Loss  . Fever    Sts. 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  Sts. had a h/a then, but denies any other sx.  No uti or uri sx./fim    HISTORY OF PRESENT ILLNESS:  Michele Yu is a 40 year old woman Who had diplopia, numbness and urinary incontinence in late November and early December 2016. MRI was abnormal and showed a focus in the right pons and a few other patchy enhancing foci elsewhere. She improved significantly with steroids. A repeat MRI in January 2017 old that the pontine lesion resolved though the other foci were still present but not enhancing. She improved practically to baseline. Lumbar puncture did not show oligoclonal bands though there was a pleocytosis. Lupus anticoagulant was mildly abnormal.   Additionally I checked ANA, ANCA and they were normal.      She had recovered mostly from her Dec/Jan episode but started having trouble walking again this past week.  Additionally, migraines are much worse.     She has also lost 30 pounds over the past 2-3 weeks.     She lost her appetite but tries to get protein shakes.    Etiology of weight loss is not certain.  She has had a birth control implant times many years  Gait/balance/strength/sensation:   Gait is poor due to poor balance more  so than weakness, worse on left side. She veers to the left as she walks and needs to hold on.   She denies any numbness.   Her arms feel fine.     Migraines:   She has daily migraines with constant pain in the back of her head.    She got IV meds in ER yesterday and headache is better.      She has stopped smoking.      History of diplopia/numbness:    She presented with diplopia and right facial numbness in early December.  She was admitted  and received 5 days of IV steroids. While she was in the hospital, she did have urinary incontinence.  She had one or 2 more episodes after she was discharged. She did not note any major cognitive dysfunction while she was in the hospital. However, she thinks that she was a little bit slowed in her thoughts.  While there, she had an MRI of the brain that showed a white matter lesion in the right side of the mid brain to the adjacent thalamus and also some patchy white matter changes in the hemispheres. There was patchy enhancement of some of these foci.   MRI of the cervical spinal cord did not show any demyelinating lesions. She did have mild degenerative changes that were unlikely to be playing a role. I reviewed these MRIs and concur with the official interpretation. A lumbar puncture was performed. The cerebrospinal fluid  did not show elevated IgG nor oligoclonal bands. However, she did have increased number of white blood cells (175), with a pleocytosis with lymphocytes > neutrophils.     By the time she was discharged, the facial numbness had resolved and the diplopia was almost completely resolved.    REVIEW OF SYSTEMS: Constitutional: No fevers, chills, sweats, or change in appetite Eyes: No visual changes, double vision, eye pain Ear, nose and throat: No hearing loss, ear pain, nasal congestion, sore throat Cardiovascular: No chest pain, palpitations Respiratory: No shortness of breath at rest or with exertion.   No wheezes GastrointestinaI: No  nausea, vomiting, diarrhea, abdominal pain, fecal incontinence Genitourinary: No dysuria, urinary retention or frequency.  No nocturia. Musculoskeletal: No neck pain, back pain Integumentary: No rash, pruritus, skin lesions Neurological: as above uPsychiatric: No depression at this time.  No anxiety Endocrine: No palpitations, diaphoresis, change in appetite, change in weigh or increased thirst Hematologic/Lymphatic: No anemia, purpura, petechiae. Allergic/Immunologic: No itchy/runny eyes, nasal congestion, recent allergic reactions, rashes  ALLERGIES: No Known Allergies  HOME MEDICATIONS:  Current outpatient prescriptions:  .  acetaminophen (TYLENOL) 325 MG tablet, Take 2 tablets (650 mg total) by mouth every 6 (six) hours as needed for mild pain or headache., Disp: , Rfl:  .  aspirin-acetaminophen-caffeine (MIGRAINE FORMULA) 250-250-65 MG tablet, Take 2 tablets by mouth every 6 (six) hours as needed for headache or migraine., Disp: , Rfl:  .  etonogestrel (NEXPLANON) 68 MG IMPL implant, 1 each by Subdermal route once. Placed 11/02/2013, Disp: , Rfl:  .  valACYclovir (VALTREX) 500 MG tablet, Take 500 mg by mouth daily., Disp: , Rfl:  .  ALPRAZolam (XANAX) 0.5 MG tablet, Take one tablet 30 minutes prior to mri.  Take the other tablet with you to your appointment and repeat if needed.  You must not drive or drink alcohol while taking Alprazolam. (Patient not taking: Reported on 11/23/2015), Disp: 2 tablet, Rfl: 0  PAST MEDICAL HISTORY: Past Medical History  Diagnosis Date  . Genital HSV   . Hypertension 2003    "just right after I had my 1st baby"  . Stomach ulcer   . Daily headache   . Anxiety   . Depression   . Panic attacks     PAST SURGICAL HISTORY: Past Surgical History  Procedure Laterality Date  . Appendectomy  1992  . Dilation and curettage of uterus      S/P "miscarriage"    FAMILY HISTORY: Family History  Problem Relation Age of Onset  . Heart attack Father      SOCIAL HISTORY:  Social History   Social History  . Marital Status: Married    Spouse Name: N/A  . Number of Children: N/A  . Years of Education: N/A   Occupational History  . Not on file.   Social History Main Topics  . Smoking status: Former Smoker -- 1.00 packs/day for 19 years    Types: Cigarettes    Quit date: 01/19/2015  . Smokeless tobacco: Never Used  . Alcohol Use: 8.4 oz/week    14 Glasses of wine per week     Comment: 08-03-15--sts. drinks 3-4 beers daily.  . Drug Use: Yes    Special: Marijuana     Comment: 07/20/2015 "I've experimented w/marijuana"  . Sexual Activity: Yes    Birth Control/ Protection: Condom, Spermicide   Other Topics Concern  . Not on file   Social History Narrative     PHYSICAL EXAM  Filed Vitals:  12/23/15 0829  BP: 106/66  Pulse: 78  Resp: 16  Height: '5\' 4"'$  (1.626 m)  Weight: 106 lb 8 oz (48.308 kg)    Body mass index is 18.27 kg/(m^2).   General: The patient is a thin woman in no acute distress  Neck:   She has normal range of motion. Appearance is normal. He is tender over the left posterior scalp and occiput.  Neurologic Exam  Mental status: The patient is alert and oriented x 3 at the time of the examination. The patient has apparent normal recent and remote memory, with an apparently normal attention span and concentration ability.   Speech is normal.  Cranial nerves: Extraocular movements are full. Pupils are equal, round, and reactive to light and accomodation.  Visual fields are full.  Facial symmetry is present. There is good facial sensation to soft touch bilaterally.Facial strength is normal.  Trapezius and sternocleidomastoid strength is normal. No dysarthria is noted.  The tongue is midline, and the patient has symmetric elevation of the soft palate. No obvious hearing deficits are noted.  Motor:  Muscle bulk is normal.   Tone is normal. Strength is  5 / 5 in all 4 extremities.   Sensory: Sensory testing  is intact to touch and vibration in arms, reduced vib in left leg..  Coordination: Cerebellar testing reveals good right but poor left finger-nose-finger and poor bilateral HTS.  Gait and station: Station is normal.   Gait is wide and ataxic. Cannot tandem gait . Romberg is borderline.   Reflexes: Deep tendon reflexes are increased in legs      DIAGNOSTIC DATA (LABS, IMAGING, TESTING) - I reviewed patient records, labs, notes, testing and imaging myself where available.  Lab Results  Component Value Date   WBC 5.4 12/22/2015   HGB 12.6 12/22/2015   HCT 36.6 12/22/2015   MCV 89.7 12/22/2015   PLT 347 12/22/2015      Component Value Date/Time   NA 136 12/22/2015 0932   K 4.0 12/22/2015 0932   CL 98* 12/22/2015 0932   CO2 29 12/22/2015 0932   GLUCOSE 189* 12/22/2015 0932   BUN 19 12/22/2015 0932   CREATININE 0.75 12/22/2015 0932   CALCIUM 9.7 12/22/2015 0932   PROT 8.7* 12/22/2015 0932   ALBUMIN 4.1 12/22/2015 0932   AST 17 12/22/2015 0932   ALT 10* 12/22/2015 0932   ALKPHOS 76 12/22/2015 0932   BILITOT 0.6 12/22/2015 0932   GFRNONAA >60 12/22/2015 0932   GFRAA >60 12/22/2015 0932       ASSESSMENT AND PLAN  Ataxic gait - Plan: ANA w/Reflex, Lupus anticoagulant, MR Brain W Wo Contrast, MR Cervical Spine W Wo Contrast, MR Thoracic Spine W Wo Contrast, Sedimentation rate, Angiotensin converting enzyme  Chronic migraine w/o aura w/o status migrainosus, not intractable - Plan: ANA w/Reflex, Lupus anticoagulant, MR Brain W Wo Contrast, MR Cervical Spine W Wo Contrast, MR Thoracic Spine W Wo Contrast, Sedimentation rate, Angiotensin converting enzyme  Loss of weight - Plan: ANA w/Reflex, Lupus anticoagulant, Sedimentation rate, Angiotensin converting enzyme   Differential diagnosis of her abnormal MRI and neurologic events includes multiple sclerosis, vasculitis and other inflammatory processes.   She did not have oligoclonal bands in the CSF and the lupus anticoagulant was  mildly positive.  1.   MRI of the brain, cervical spine and thoracic spine to evaluate current severe gait ataxia.    If we see changes consistent with MS, I will start her on a disease modifying therapy. Additionally  we will check ANA, ACE, ESR and repeat the lupus anticoagulant.   If these are significantly abnormal, consider vasculitis or sarcoid as a more likely dx.    2.  For her headaches and weight loss I am starting Depakote 500 mg. This was to relate her appetite as weight gain is a common issue with Depakote. 3.    She will return to see me in 4 weeks or sooner if there are new or worsening neurologic symptoms.  45 minutes face-to-face evaluation with greater than one half of the time counseling and coordinating care about her symptoms, DDx, weight loss and other issues.  Layten Aiken A. Felecia Shelling, MD, PhD 04/24/7472, 4:03 AM Certified in Neurology, Clinical Neurophysiology, Sleep Medicine, Pain Medicine and Neuroimaging  [facility] [address] [place], [place] [address] [phone]

## 2015-12-26 LAB — LUPUS ANTICOAGULANT
Dilute Viper Venom Time: 36.6 s (ref 0.0–47.0)
PTT Lupus Anticoagulant: 29.6 s (ref 0.0–43.6)
THROMBIN TIME: 17.3 s (ref 0.0–20.9)
dPT Confirm Ratio: 1.11 Ratio (ref 0.00–1.40)
dPT: 38.4 s (ref 0.0–55.0)

## 2015-12-26 LAB — ANGIOTENSIN CONVERTING ENZYME: ANGIO CONVERT ENZYME: 36 U/L (ref 14–82)

## 2015-12-26 LAB — SEDIMENTATION RATE: SED RATE: 11 mm/h (ref 0–32)

## 2015-12-26 LAB — ANA W/REFLEX: Anti Nuclear Antibody(ANA): NEGATIVE

## 2015-12-30 ENCOUNTER — Telehealth: Payer: Self-pay | Admitting: *Deleted

## 2015-12-30 NOTE — Telephone Encounter (Signed)
I have spoken with Michele Yu this morning, and per RAS, advised that labs done in our office are ok.  She verbalized understanding of same/fim

## 2015-12-30 NOTE — Telephone Encounter (Signed)
-----   Message from Asa Lente, MD sent at 12/28/2015  9:20 AM EDT ----- Please note that her lab work looks good. We can give her a another call back after her MRIs.

## 2016-01-11 DIAGNOSIS — Z0289 Encounter for other administrative examinations: Secondary | ICD-10-CM

## 2016-01-20 ENCOUNTER — Ambulatory Visit (INDEPENDENT_AMBULATORY_CARE_PROVIDER_SITE_OTHER): Payer: BLUE CROSS/BLUE SHIELD | Admitting: Neurology

## 2016-01-20 ENCOUNTER — Encounter: Payer: Self-pay | Admitting: Neurology

## 2016-01-20 VITALS — BP 110/78 | HR 76 | Resp 14 | Ht 64.0 in | Wt 107.7 lb

## 2016-01-20 DIAGNOSIS — R9089 Other abnormal findings on diagnostic imaging of central nervous system: Secondary | ICD-10-CM

## 2016-01-20 DIAGNOSIS — R260 Ataxic gait: Secondary | ICD-10-CM | POA: Diagnosis not present

## 2016-01-20 DIAGNOSIS — G43709 Chronic migraine without aura, not intractable, without status migrainosus: Secondary | ICD-10-CM

## 2016-01-20 DIAGNOSIS — R32 Unspecified urinary incontinence: Secondary | ICD-10-CM | POA: Insufficient documentation

## 2016-01-20 DIAGNOSIS — N39498 Other specified urinary incontinence: Secondary | ICD-10-CM | POA: Diagnosis not present

## 2016-01-20 DIAGNOSIS — H532 Diplopia: Secondary | ICD-10-CM

## 2016-01-20 DIAGNOSIS — R930 Abnormal findings on diagnostic imaging of skull and head, not elsewhere classified: Secondary | ICD-10-CM | POA: Diagnosis not present

## 2016-01-20 MED ORDER — MELOXICAM 7.5 MG PO TABS: 7.5000 mg | ORAL_TABLET | Freq: Every day | ORAL | Status: DC

## 2016-01-20 MED ORDER — ALPRAZOLAM 0.5 MG PO TABS: ORAL_TABLET | ORAL | Status: DC

## 2016-01-20 NOTE — Progress Notes (Signed)
[facility]  PATIENT: Michele Yu DOB: 01/27/1976  REFERRING DOCTOR OR PCP:  Phillips Odor, MD SOURCE: patient, EMR records, MRI images on PACS  _________________________________   HISTORICAL  CHIEF COMPLAINT:  Chief Complaint  Patient presents with  . Abnormal MRI    She has not had MRI's ordered at last ov.  Sts. she has been more confused and gets dates mixed up; misses appointments.  She has gained 1 lb. since last ov.  Sts. left knee gives way and she loses her balance. She would like a note excusing her from work from May 29 thru January 27, 2016/fim  . Migraines  . Weight Loss    HISTORY OF PRESENT ILLNESS:  Michele Yu is a 40 year old woman.  She continues to report some cognitive difficulties as well as poor balance.   She feels that she improved earlier this year but that symptoms have been worse the last few months. She never showed for the MRI of the brain and spine that was ordered at her last visit.  Cognitive:   She is having more confusion and trouble with focus and attention.     This has not improved any and is worse according to her husband, over the last month.   She often is forgetting simple tasks. She also reports decreased focus and attention.  Gait/balance/strength/sensation:   Gait is poor due to poor balance more so than weakness.   The left leg gives out some and she veers to the left as she walks.    She denies any numbness.   Her arms feel fine.   Her handwriting is more sloppy.      Migraines:   She has daily migraines with constant pain in the front of her head bilaterall.    She got IV meds in ER yesterday and headache is better.      Vision:    She has no more blurred or double vision.  Bladder:   She is having incontinence and wears Depends out of the house.    She has no hesitancy.    Bowel is fine.  History of diplopia/numbness:    She presented with diplopia and right facial numbness in late November.  She was admitted   and received 5 days of IV steroids. While she was in the hospital, she did have urinary incontinence.  She had one or 2 more episodes after she was discharged. She did not note any major cognitive dysfunction while she was in the hospital. However, she thinks that she was a little bit slowed in her thoughts.  While there, she had an MRI of the brain that showed a white matter lesion in the right side of the mid brain to the adjacent thalamus and also some patchy white matter changes in the hemispheres. There was patchy enhancement of some of these foci.   MRI of the cervical spinal cord did not show any demyelinating lesions. She did have mild degenerative changes that were unlikely to be playing a role. I reviewed these MRIs and concur with the official interpretation. A lumbar puncture was performed. The cerebrospinal fluid did not show elevated IgG nor oligoclonal bands. However, she did have increased number of white blood cells (175), with a pleocytosis with lymphocytes > neutrophils.     By the time she was discharged, the facial numbness had resolved and the diplopia was almost completely resolved.   MRI History:  MRI late November 2016 was abnormal and showed a focus  in the right pons and a few other patchy enhancing foci elsewhere. She improved significantly with steroids. A repeat MRI in January 2017 showed that the pontine lesion resolved though the other foci were still present but not enhancing. Lumbar puncture did not show oligoclonal bands though there was a pleocytosis. Lupus anticoagulant was mildly abnormal.   Additionally I checked ANA, ANCA and they were normal.        REVIEW OF SYSTEMS: Constitutional: No fevers, chills, sweats, or change in appetite Eyes: No visual changes, double vision, eye pain Ear, nose and throat: No hearing loss, ear pain, nasal congestion, sore throat Cardiovascular: No chest pain, palpitations Respiratory: No shortness of breath at rest or with exertion.    No wheezes GastrointestinaI: No nausea, vomiting, diarrhea, abdominal pain, fecal incontinence Genitourinary: No dysuria, urinary retention or frequency.  No nocturia. Musculoskeletal: No neck pain, back pain Integumentary: No rash, pruritus, skin lesions Neurological: as above uPsychiatric: No depression at this time.  No anxiety Endocrine: No palpitations, diaphoresis, change in appetite, change in weigh or increased thirst Hematologic/Lymphatic: No anemia, purpura, petechiae. Allergic/Immunologic: No itchy/runny eyes, nasal congestion, recent allergic reactions, rashes  ALLERGIES: No Known Allergies  HOME MEDICATIONS:  Current outpatient prescriptions:  .  acetaminophen (TYLENOL) 325 MG tablet, Take 2 tablets (650 mg total) by mouth every 6 (six) hours as needed for mild pain or headache., Disp: , Rfl:  .  ALPRAZolam (XANAX) 0.5 MG tablet, Take one tablet 30 minutes prior to mri.  Take the other tablet with you to your appointment and repeat if needed.  You must not drive or drink alcohol while taking Alprazolam., Disp: 2 tablet, Rfl: 0 .  aspirin-acetaminophen-caffeine (MIGRAINE FORMULA) 250-250-65 MG tablet, Take 2 tablets by mouth every 6 (six) hours as needed for headache or migraine., Disp: , Rfl:  .  divalproex (DEPAKOTE) 500 MG DR tablet, Take 1 tablet (500 mg total) by mouth daily., Disp: 30 tablet, Rfl: 2 .  etonogestrel (NEXPLANON) 68 MG IMPL implant, 1 each by Subdermal route once. Placed 11/02/2013, Disp: , Rfl:  .  valACYclovir (VALTREX) 500 MG tablet, Take 500 mg by mouth daily., Disp: , Rfl:   PAST MEDICAL HISTORY: Past Medical History  Diagnosis Date  . Genital HSV   . Hypertension 2003    "just right after I had my 1st baby"  . Stomach ulcer   . Daily headache   . Anxiety   . Depression   . Panic attacks     PAST SURGICAL HISTORY: Past Surgical History  Procedure Laterality Date  . Appendectomy  1992  . Dilation and curettage of uterus      S/P  "miscarriage"    FAMILY HISTORY: Family History  Problem Relation Age of Onset  . Heart attack Father     SOCIAL HISTORY:  Social History   Social History  . Marital Status: Married    Spouse Name: N/A  . Number of Children: N/A  . Years of Education: N/A   Occupational History  . Not on file.   Social History Main Topics  . Smoking status: Former Smoker -- 1.00 packs/day for 19 years    Types: Cigarettes    Quit date: 01/19/2015  . Smokeless tobacco: Never Used  . Alcohol Use: 8.4 oz/week    14 Glasses of wine per week     Comment: 08-03-15--sts. drinks 3-4 beers daily.  . Drug Use: Yes    Special: Marijuana     Comment: 07/20/2015 "I've experimented w/marijuana"  .  Sexual Activity: Yes    Birth Control/ Protection: Condom, Spermicide   Other Topics Concern  . Not on file   Social History Narrative     PHYSICAL EXAM  Filed Vitals:   01/20/16 0857  BP: 110/78  Pulse: 76  Resp: 14  Height:  (1.626 m)  Weight: 107 lb 11.2 oz (48.852 kg)    Body mass index is 18.48 kg/(m^2).   General: The patient is a thin woman in no acute distress  Neck:   She has normal range of motion. Appearance is normal. He is tender over the left posterior scalp and occiput.  Neurologic Exam  Mental status: The patient is alert and oriented x 3 at the time of the examination. The patient has apparent normal recent and remote memory, with an apparently normal attention span and concentration ability.   Speech is normal.  Cranial nerves: Extraocular movements are full. Pupils are equal, round, and reactive to light and accomodation.  Visual fields are full.  Facial symmetry is present. There is good facial sensation to soft touch bilaterally.Facial strength is normal.  Trapezius and sternocleidomastoid strength is normal. No dysarthria is noted.  The tongue is midline, and the patient has symmetric elevation of the soft palate. No obvious hearing deficits are noted.  Motor:   Muscle bulk is normal.   Tone is normal. Strength is  5 / 5 in all 4 extremities.   Sensory: Sensory testing is intact to touch and vibration in arms, reduced vib in left leg..  Coordination: Cerebellar testing reveals good right but poor left finger-nose-finger and poor bilateral HTS.  Gait and station: Station is normal.   Gait is wide and ataxic. Cannot tandem gait . Romberg is borderline.   Reflexes: Deep tendon reflexes are increased in legs      DIAGNOSTIC DATA (LABS, IMAGING, TESTING) - I reviewed patient records, labs, notes, testing and imaging myself where available.  Lab Results  Component Value Date   WBC 5.4 12/22/2015   HGB 12.6 12/22/2015   HCT 36.6 12/22/2015   MCV 89.7 12/22/2015   PLT 347 12/22/2015      Component Value Date/Time   NA 136 12/22/2015 0932   K 4.0 12/22/2015 0932   CL 98* 12/22/2015 0932   CO2 29 12/22/2015 0932   GLUCOSE 189* 12/22/2015 0932   BUN 19 12/22/2015 0932   CREATININE 0.75 12/22/2015 0932   CALCIUM 9.7 12/22/2015 0932   PROT 8.7* 12/22/2015 0932   ALBUMIN 4.1 12/22/2015 0932   AST 17 12/22/2015 0932   ALT 10* 12/22/2015 0932   ALKPHOS 76 12/22/2015 0932   BILITOT 0.6 12/22/2015 0932   GFRNONAA >60 12/22/2015 0932   GFRAA >60 12/22/2015 0932       ASSESSMENT AND PLAN  Abnormal brain MRI  Ataxic gait  Diplopia  Chronic migraine w/o aura w/o status migrainosus, not intractable   1.   Continue Depakote 500 mg for headache. Add meloxicam 2.   She never had the repeat MRI of the brain and the MRI of the cervical spine to help [place] determine if her disorder is more consistent with MS or with other processes. These will be reordered.  3.    Note to be out of work end of August. She will return to see me in 2-3 months or sooner if there are new or worsening neurologic symptoms.  Kastin Cerda A. Epimenio Foot, MD, PhD 01/20/2016, 9:08 AM Certified in Neurology, Clinical Neurophysiology, Sleep Medicine, Pain Medicine and  Neuroimaging  [facility] [address] [place], [place] [address] [phone]

## 2016-01-25 ENCOUNTER — Ambulatory Visit (INDEPENDENT_AMBULATORY_CARE_PROVIDER_SITE_OTHER): Payer: BLUE CROSS/BLUE SHIELD

## 2016-01-25 DIAGNOSIS — R260 Ataxic gait: Secondary | ICD-10-CM

## 2016-01-25 DIAGNOSIS — G43709 Chronic migraine without aura, not intractable, without status migrainosus: Secondary | ICD-10-CM

## 2016-01-26 MED ORDER — GADOPENTETATE DIMEGLUMINE 469.01 MG/ML IV SOLN: 10.0000 mL | Freq: Once | INTRAVENOUS | Status: AC | PRN

## 2016-01-27 ENCOUNTER — Telehealth: Payer: Self-pay | Admitting: *Deleted

## 2016-01-27 NOTE — Telephone Encounter (Signed)
I spoke with Michele Yu about her MRI results.  She has new lesions compared to her last MRI and I want her to get 3 days of IV Solu-Medrol and to follow-up with me the following Monday.

## 2016-01-27 NOTE — Telephone Encounter (Signed)
I spoke to pt and made f/u appt for 0840 02-06-16 Monday.  Orders for solumedrol given to Tina in intrafusion she is to call pt to have steroids next week.   Pt is aware of plan.

## 2016-01-27 NOTE — Telephone Encounter (Signed)
Gave IV solumedrol orders to tina in intrafusion, (3 day infusion starting Monday, then needs f/u on following Monday.

## 2016-02-06 ENCOUNTER — Ambulatory Visit: Payer: BLUE CROSS/BLUE SHIELD | Admitting: Neurology

## 2016-03-05 ENCOUNTER — Ambulatory Visit: Payer: BLUE CROSS/BLUE SHIELD | Admitting: Neurology

## 2016-03-07 ENCOUNTER — Encounter: Payer: Self-pay | Admitting: Neurology

## 2016-03-07 ENCOUNTER — Ambulatory Visit (INDEPENDENT_AMBULATORY_CARE_PROVIDER_SITE_OTHER): Payer: BLUE CROSS/BLUE SHIELD | Admitting: Neurology

## 2016-03-07 VITALS — BP 106/76 | HR 72 | Resp 16 | Ht 64.0 in | Wt 108.0 lb

## 2016-03-07 DIAGNOSIS — G4489 Other headache syndrome: Secondary | ICD-10-CM

## 2016-03-07 DIAGNOSIS — G379 Demyelinating disease of central nervous system, unspecified: Secondary | ICD-10-CM | POA: Diagnosis not present

## 2016-03-07 DIAGNOSIS — R260 Ataxic gait: Secondary | ICD-10-CM

## 2016-03-07 DIAGNOSIS — N39498 Other specified urinary incontinence: Secondary | ICD-10-CM | POA: Diagnosis not present

## 2016-03-07 DIAGNOSIS — R4189 Other symptoms and signs involving cognitive functions and awareness: Secondary | ICD-10-CM | POA: Diagnosis not present

## 2016-03-07 MED ORDER — PREDNISONE 20 MG PO TABS
ORAL_TABLET | ORAL | Status: DC
Start: 2016-03-07 — End: 2016-10-23

## 2016-03-07 MED ORDER — AZATHIOPRINE 50 MG PO TABS: ORAL_TABLET | ORAL | Status: DC

## 2016-03-07 NOTE — Progress Notes (Signed)
[facility]  PATIENT: Michele Yu DOB: 1976-02-15  REFERRING DOCTOR OR PCP:  Phillips Odor, MD SOURCE: patient, EMR records, MRI images on PACS  _________________________________   HISTORICAL  CHIEF COMPLAINT:  Chief Complaint  Patient presents with  . Abnormal MRI    Sts. h/a's have resolved.  Sts. Depakote caused excessive drowsiness so she stopped it.  She has not needed to take Meloxicam. Sts. no improvement in balance or memory.  Husband sts. she also gets agitated very easily/fim  . Memory Loss  . Headache    HISTORY OF PRESENT ILLNESS:  Michele Yu is a 40 year old woman Who has presented with abnormal MRI with enhancing foci. It her presence, I reviewed the last MRI and compared to her previous one.  It is abnormal showing multiple foci, with many in the pons and middle cerebellar peduncles. Some foci enhanced. Many of these foci were not present on her earlier MRIs. An MRI she had about a month after her initial MRI (that showed enhancing lesions) no significant improvement and this third MRI is definitely worse. We discussed possible etiologies. The pattern is not really typical for MS will I cannot completely rule that out. 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  This could certainly explain her rapid improvement both time she has received IV steroids. She continues to report some cognitive difficulties as well as poor balance.   She felt that she improved earlier this year but that symptoms  Then worsened and are again better.  After her last MRI in June showing enhancing lesions, we gave her 3 days IV solu-medrol and she improved.   Headaches much better with steroids.   She feels marijuana helps her symptoms.     Cognitive:   She still has some confusion and trouble with focus and attention.  She is irritable at times.   This has improved since the steroids, according  to her husband, over the last month.    Gait/balance/strength/sensation:   Gait is poor due to poor balance more so than weakness.   The left leg gives out some but is better than last month.    She no longer veers with walking.      She denies any numbness.   Her arms feel fine.      Vision:    She has no more blurred or double vision.  Bladder:   Bladder function improved after the steroids..    She has no hesitancy.    Bowel is fine.  History of diplopia/numbness:    She presented with diplopia and right facial numbness in late November.  She was admitted  and received 5 days of IV steroids. While she was in the hospital, she did have urinary incontinence.  She had one or 2 more episodes after she was discharged. She did not note any major cognitive dysfunction while she was in the hospital. However, she thinks that she was a little bit slowed in her thoughts.  While there, she had an MRI of the brain that showed a white matter lesion in the right side of the mid brain to the adjacent thalamus and also some patchy white matter changes in the hemispheres. There was patchy enhancement of some of these foci.   MRI of the cervical spinal cord did not show any demyelinating lesions. She did have mild degenerative changes that were unlikely to be playing a role. I reviewed these MRIs and concur with the  official interpretation. A lumbar puncture was performed. The cerebrospinal fluid did not show elevated IgG nor oligoclonal bands. However, she did have increased number of white blood cells (175), with a pleocytosis with lymphocytes > neutrophils.     By the time she was discharged, the facial numbness had resolved and the diplopia was almost completely resolved.   MRI and lab History:  MRI late November 2016 was abnormal and showed a focus in the right pons and a few other patchy enhancing foci elsewhere. She improved significantly with steroids. A repeat MRI in January 2017 showed that the pontine lesion  resolved though the other foci were still present but not enhancing. Lumbar puncture did not show oligoclonal bands though there was a pleocytosis. Lupus anticoagulant was mildly abnormal.   Additionally I checked ANA, ANCA and they were normal.    Repeat MRI June 2017 showed several foci not present on earlier MRIs including some that enhanced in the posterior fossa.    REVIEW OF SYSTEMS: Constitutional: No fevers, chills, sweats, or change in appetite Eyes: No visual changes, double vision, eye pain Ear, nose and throat: No hearing loss, ear pain, nasal congestion, sore throat Cardiovascular: No chest pain, palpitations Respiratory: No shortness of breath at rest or with exertion.   No wheezes GastrointestinaI: No nausea, vomiting, diarrhea, abdominal pain, fecal incontinence Genitourinary: No dysuria, urinary retention or frequency.  No nocturia. Musculoskeletal: No neck pain, back pain Integumentary: No rash, pruritus, skin lesions Neurological: as above uPsychiatric: No depression at this time.  No anxiety Endocrine: No palpitations, diaphoresis, change in appetite, change in weigh or increased thirst Hematologic/Lymphatic: No anemia, purpura, petechiae. Allergic/Immunologic: No itchy/runny eyes, nasal congestion, recent allergic reactions, rashes  ALLERGIES: No Known Allergies  HOME MEDICATIONS:  Current outpatient prescriptions:  .  acetaminophen (TYLENOL) 325 MG tablet, Take 2 tablets (650 mg total) by mouth every 6 (six) hours as needed for mild pain or headache., Disp: , Rfl:  .  etonogestrel (NEXPLANON) 68 MG IMPL implant, 1 each by Subdermal route once. Placed 11/02/2013, Disp: , Rfl:  .  valACYclovir (VALTREX) 500 MG tablet, Take 500 mg by mouth daily., Disp: , Rfl:  .  divalproex (DEPAKOTE) 500 MG DR tablet, Take 1 tablet (500 mg total) by mouth daily. (Patient not taking: Reported on 03/07/2016), Disp: 30 tablet, Rfl: 2 .  meloxicam (MOBIC) 7.5 MG tablet, Take 1 tablet  (7.5 mg total) by mouth daily. (Patient not taking: Reported on 03/07/2016), Disp: 30 tablet, Rfl: 5 No current facility-administered medications for this visit.  Facility-Administered Medications Ordered in Other Visits:  .  gadopentetate dimeglumine (MAGNEVIST) injection 10 mL, 10 mL, Intravenous, Once PRN, Asa Lente, MD  PAST MEDICAL HISTORY: Past Medical History  Diagnosis Date  . Genital HSV   . Hypertension 2003    "just right after I had my 1st baby"  . Stomach ulcer   . Daily headache   . Anxiety   . Depression   . Panic attacks     PAST SURGICAL HISTORY: Past Surgical History  Procedure Laterality Date  . Appendectomy  1992  . Dilation and curettage of uterus      S/P "miscarriage"    FAMILY HISTORY: Family History  Problem Relation Age of Onset  . Heart attack Father     SOCIAL HISTORY:  Social History   Social History  . Marital Status: Married    Spouse Name: N/A  . Number of Children: N/A  . Years of Education: N/A  Occupational History  . Not on file.   Social History Main Topics  . Smoking status: Former Smoker -- 1.00 packs/day for 19 years    Types: Cigarettes    Quit date: 01/19/2015  . Smokeless tobacco: Never Used  . Alcohol Use: 8.4 oz/week    14 Glasses of wine per week     Comment: 08-03-15--sts. drinks 3-4 beers daily.  . Drug Use: Yes    Special: Marijuana     Comment: 07/20/2015 "I've experimented w/marijuana"  . Sexual Activity: Yes    Birth Control/ Protection: Condom, Spermicide   Other Topics Concern  . Not on file   Social History Narrative     PHYSICAL EXAM  Filed Vitals:   03/07/16 1140  BP: 106/76  Pulse: 72  Resp: 16  Height:  (1.626 m)  Weight: 108 lb (48.988 kg)    Body mass index is 18.53 kg/(m^2).   General: The patient is a thin woman in no acute distress  Neck:   She has normal range of motion. Appearance is normal. He is tender over the left posterior scalp and  occiput.  Neurologic Exam  Mental status: The patient is alert and oriented x 3 at the time of the examination. The patient has apparent normal recent and remote memory, with an apparently normal attention span and concentration ability.   Speech is normal.  Cranial nerves: Extraocular movements are full. Pupils are equal, round, and reactive to light and accomodation.  Visual fields are full.  Facial symmetry is present. There is good facial sensation to soft touch bilaterally.Facial strength is normal.  Trapezius and sternocleidomastoid strength is normal. No dysarthria is noted.  The tongue is midline, and the patient has symmetric elevation of the soft palate. No obvious hearing deficits are noted.  Motor:  Muscle bulk is normal.   Tone is normal. Strength is  5 / 5 in all 4 extremities.   Sensory: Sensory testing is intact to touch and vibration in arms, reduced vib in left leg..  Coordination: Cerebellar testing reveals good right but poor left finger-nose-finger and poor bilateral HTS.  Gait and station: Station is normal.   Gait is wide and ataxic but better than last visit.  She cannot tandem gait . Romberg is borderline.   Reflexes: Deep tendon reflexes are increased in legs      DIAGNOSTIC DATA (LABS, IMAGING, TESTING) - I reviewed patient records, labs, notes, testing and imaging myself where available.  Lab Results  Component Value Date   WBC 5.4 12/22/2015   HGB 12.6 12/22/2015   HCT 36.6 12/22/2015   MCV 89.7 12/22/2015   PLT 347 12/22/2015      Component Value Date/Time   NA 136 12/22/2015 0932   K 4.0 12/22/2015 0932   CL 98* 12/22/2015 0932   CO2 29 12/22/2015 0932   GLUCOSE 189* 12/22/2015 0932   BUN 19 12/22/2015 0932   CREATININE 0.75 12/22/2015 0932   CALCIUM 9.7 12/22/2015 0932   PROT 8.7* 12/22/2015 0932   ALBUMIN 4.1 12/22/2015 0932   AST 17 12/22/2015 0932   ALT 10* 12/22/2015 0932   ALKPHOS 76 12/22/2015 0932   BILITOT 0.6 12/22/2015 0932    GFRNONAA >60 12/22/2015 0932   GFRAA >60 12/22/2015 0932       ASSESSMENT AND PLAN  Demyelinating disease of central nervous system, unspecified (HCC)  Ataxic gait  Other urinary incontinence  Other headache syndrome  Disturbed cognition   The etiology of her symptoms  is not clear, 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 I will place her on steroids and Imuran as a steroid sparing agent and try to taper the steroids further after a couple months.   1.   Prednisone 60 mg po daily and titrate to 60 mg po qod. 2.   Azathioprine titrate up to 2 pills po bid     3.   She will return to see me in 2-3 months or sooner if there are new or worsening neurologic symptoms.  45 minute face-to-face evaluation with greater than one half of the time counseling and coordinating care about her unusual neurologic clinical and imaging findings and possible treatments.  Richard A. Epimenio Foot, MD, PhD 03/07/2016, 2:19 PM Certified in Neurology, Clinical Neurophysiology, Sleep Medicine, Pain Medicine and Neuroimaging  [facility] [address] [place], [place] [address] [phone]

## 2016-03-16 ENCOUNTER — Telehealth: Payer: Self-pay | Admitting: Neurology

## 2016-03-16 NOTE — Telephone Encounter (Signed)
Spouse Ethelene Browns [phone] called to advise his work is needing wife's condition, prognosis, duration of condition for disability insurance purposes. Asked spouse if they have given paperwork for this, spouse stated yes, advised of $50 form fee and up to 14 day turn around for forms, spouse will fax form to our office today.

## 2016-03-26 ENCOUNTER — Ambulatory Visit: Payer: BLUE CROSS/BLUE SHIELD | Admitting: Neurology

## 2016-04-18 ENCOUNTER — Encounter: Payer: Self-pay | Admitting: Neurology

## 2016-04-18 ENCOUNTER — Ambulatory Visit (INDEPENDENT_AMBULATORY_CARE_PROVIDER_SITE_OTHER): Payer: BLUE CROSS/BLUE SHIELD | Admitting: Neurology

## 2016-04-18 VITALS — BP 130/78 | HR 88 | Resp 18 | Ht 64.0 in | Wt 118.5 lb

## 2016-04-18 DIAGNOSIS — H532 Diplopia: Secondary | ICD-10-CM | POA: Diagnosis not present

## 2016-04-18 DIAGNOSIS — G4489 Other headache syndrome: Secondary | ICD-10-CM

## 2016-04-18 DIAGNOSIS — R634 Abnormal weight loss: Secondary | ICD-10-CM | POA: Diagnosis not present

## 2016-04-18 DIAGNOSIS — G379 Demyelinating disease of central nervous system, unspecified: Secondary | ICD-10-CM

## 2016-04-18 DIAGNOSIS — R260 Ataxic gait: Secondary | ICD-10-CM

## 2016-04-18 MED ORDER — OXYBUTYNIN CHLORIDE 5 MG PO TABS: 5.0000 mg | ORAL_TABLET | Freq: Two times a day (BID) | ORAL | 11 refills | Status: DC

## 2016-04-18 NOTE — Patient Instructions (Signed)
Take prednisone 20 mg po daily  Take Azathiprine 50 mg po qAm and 100 mg po qHS

## 2016-04-18 NOTE — Progress Notes (Signed)
[facility]  PATIENT: Michele Yu DOB: 04/27/1976  REFERRING DOCTOR OR PCP:  Phillips Odor, MD SOURCE: patient, EMR records, MRI images on PACS  _________________________________   HISTORICAL  CHIEF COMPLAINT:  Chief Complaint  Patient presents with  . Abnormal MRI    Sts. she feels better since starting Imuran and Prednisone, but doesn't think her walking or memory have improved.  She has gained 10lbs since last ov/fim  . Gait Disturbance  . Memory Loss    HISTORY OF PRESENT ILLNESS:  Michele Yu is a 40 year old woman who has had neurologic symptoms associated with abnormal MRI with many enhancing foci starting November 2016. She continues to report some cognitive difficulties as well as poor balance, but is better than last visit.Marland Kitchen   Her headache is also better.   Cognitive:   She has less confusion and trouble with focus and attention but is not at baseline.  She is irritable at times.  Her husband feels that she does not have any "filter" and will just say things that can sometimes be hurtful to her family members.   Memory has improved since the steroids, according to her husband, over the last month.    Gait/balance/strength/sensation:   Gait is poor due to poor balance.   The left leg is better than last month.    She no longer veers with walking.   She can walk without a cane but uses one for safety.   She denies any numbness.   Her arms feel fine.      Vision:    She has no more blurred or double vision.  Bladder:   Bladder function is still a problem and she has incontinence.    She has no hesitancy.    Bowel is fine.  Headache is much better since starting prednisone.  Her last MRI showed multiple foci, with many in the pons and middle cerebellar peduncles. Some foci enhanced. Many of these foci were not present on her earlier MRIs. An MRI she had about a month after her initial MRI (that showed enhancing lesions) no significant improvement  and this third MRI is definitely worse. The etiology is uncertain. The pattern is not really typical for MS.   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  This could certainly explain her rapid improvement both time she has received IV steroids.   History of diplopia/numbness:    She presented with diplopia and right facial numbness in late November 2016.  She was admitted  and received 5 days of IV steroids. While she was in the hospital, she did have urinary incontinence.  She had one or 2 more episodes after she was discharged. She did not note any major cognitive dysfunction while she was in the hospital. However, she thinks that she was a little bit slowed in her thoughts.  While there, she had an MRI of the brain that showed a white matter lesion in the right side of the mid brain to the adjacent thalamus and also some patchy white matter changes in the hemispheres. There was patchy enhancement of some of these foci.   MRI of the cervical spinal cord did not show any demyelinating lesions. She did have mild degenerative changes that were unlikely to be playing a role. I reviewed these MRIs and concur with the official interpretation. A lumbar puncture was performed. The cerebrospinal fluid did not show elevated IgG nor oligoclonal bands. However, she did have  increased number of white blood cells (175), with a pleocytosis with lymphocytes > neutrophils.     By the time she was discharged, the facial numbness had resolved and the diplopia was almost completely resolved.   MRI and lab History:  MRI late November 2016 was abnormal and showed a focus in the right pons and a few other patchy enhancing foci elsewhere. She improved significantly with steroids. A repeat MRI in January 2017 showed that the pontine lesion resolved though the other foci were still present but not enhancing. Lumbar puncture did not show oligoclonal bands  though there was a pleocytosis. Lupus anticoagulant was mildly abnormal.   Additionally I checked ANA, ANCA and they were normal.    Repeat MRI June 2017 showed several foci not present on earlier MRIs including some that enhanced in the posterior fossa.    REVIEW OF SYSTEMS: Constitutional: No fevers, chills, sweats, or change in appetite Eyes: No visual changes, double vision, eye pain Ear, nose and throat: No hearing loss, ear pain, nasal congestion, sore throat Cardiovascular: No chest pain, palpitations Respiratory: No shortness of breath at rest or with exertion.   No wheezes GastrointestinaI: No nausea, vomiting, diarrhea, abdominal pain, fecal incontinence Genitourinary: No dysuria, urinary retention or frequency.  No nocturia. Musculoskeletal: No neck pain, back pain Integumentary: No rash, pruritus, skin lesions Neurological: as above uPsychiatric: No depression at this time.  No anxiety Endocrine: No palpitations, diaphoresis, change in appetite, change in weigh or increased thirst Hematologic/Lymphatic: No anemia, purpura, petechiae. Allergic/Immunologic: No itchy/runny eyes, nasal congestion, recent allergic reactions, rashes  ALLERGIES: No Known Allergies  HOME MEDICATIONS:  Current Outpatient Prescriptions:  .  azaTHIOprine (IMURAN) 50 MG tablet, One po in am and two po at night, Disp: 90 tablet, Rfl: 5 .  predniSONE (DELTASONE) 20 MG tablet, Take as directed, Disp: 100 tablet, Rfl: 5 .  valACYclovir (VALTREX) 500 MG tablet, Take 500 mg by mouth daily., Disp: , Rfl:  .  acetaminophen (TYLENOL) 325 MG tablet, Take 2 tablets (650 mg total) by mouth every 6 (six) hours as needed for mild pain or headache. (Patient not taking: Reported on 04/18/2016), Disp: , Rfl:  .  divalproex (DEPAKOTE) 500 MG DR tablet, Take 1 tablet (500 mg total) by mouth daily. (Patient not taking: Reported on 03/07/2016), Disp: 30 tablet, Rfl: 2 .  etonogestrel (NEXPLANON) 68 MG IMPL implant, 1  each by Subdermal route once. Placed 11/02/2013, Disp: , Rfl:  .  meloxicam (MOBIC) 7.5 MG tablet, Take 1 tablet (7.5 mg total) by mouth daily. (Patient not taking: Reported on 03/07/2016), Disp: 30 tablet, Rfl: 5 .  oxybutynin (DITROPAN) 5 MG tablet, Take 1 tablet (5 mg total) by mouth 2 (two) times daily., Disp: 60 tablet, Rfl: 11 No current facility-administered medications for this visit.   Facility-Administered Medications Ordered in Other Visits:  .  gadopentetate dimeglumine (MAGNEVIST) injection 10 mL, 10 mL, Intravenous, Once PRN, Asa Lente, MD  PAST MEDICAL HISTORY: Past Medical History:  Diagnosis Date  . Anxiety   . Daily headache   . Depression   . Genital HSV   . Hypertension 2003   "just right after I had my 1st baby"  . Panic attacks   . Stomach ulcer     PAST SURGICAL HISTORY: Past Surgical History:  Procedure Laterality Date  . APPENDECTOMY  1992  . DILATION AND CURETTAGE OF UTERUS     S/P "miscarriage"    FAMILY HISTORY: Family History  Problem Relation Age of  Onset  . Heart attack Father     SOCIAL HISTORY:  Social History   Social History  . Marital status: Married    Spouse name: N/A  . Number of children: N/A  . Years of education: N/A   Occupational History  . Not on file.   Social History Main Topics  . Smoking status: Former Smoker    Packs/day: 1.00    Years: 19.00    Types: Cigarettes    Quit date: 01/19/2015  . Smokeless tobacco: Never Used  . Alcohol use 8.4 oz/week    14 Glasses of wine per week     Comment: 08-03-15--sts. drinks 3-4 beers daily.  . Drug use:     Types: Marijuana     Comment: 07/20/2015 "I've experimented w/marijuana"  . Sexual activity: Yes    Birth control/ protection: Condom, Spermicide   Other Topics Concern  . Not on file   Social History Narrative  . No narrative on file     PHYSICAL EXAM  Vitals:   04/18/16 1307  BP: 130/78  Pulse: 88  Resp: 18  Weight: 118 lb 8 oz (53.8 kg)    Height: 5\' 4"  (1.626 m)    Body mass index is 20.34 kg/m.   General: The patient is a thin woman in no acute distress  Neck:   She has normal range of motion. Appearance is normal. He is tender over the left posterior scalp and occiput.  Neurologic Exam  Mental status: The patient is alert and oriented x 3 at the time of the examination. The patient has apparent normal recent and remote memory, with an apparently normal attention span and concentration ability.   Speech is normal.  Cranial nerves: Extraocular movements are full. Pupils are equal, round, and reactive to light and accomodation.  Visual fields are full.  Facial symmetry is present. There is good facial sensation to soft touch bilaterally.Facial strength is normal.  Trapezius and sternocleidomastoid strength is normal. No dysarthria is noted.  The tongue is midline, and the patient has symmetric elevation of the soft palate. No obvious hearing deficits are noted.  Motor:  Muscle bulk is normal.   Tone is normal. Strength is  5 / 5 in all 4 extremities.   Sensory: Sensory testing is intact to touch and vibration in arms, reduced vib in left leg..  Coordination: Cerebellar testing reveals good right but mildly reduced left finger-nose-finger and poor bilateral HTS.  Gait and station: Station is normal.   Gait is ataxic but better than last visit.  She has cane but can walk without it.   She has very wide tandem gait . Romberg is borderline.   Reflexes: Deep tendon reflexes are increased in legs with spread at her knees     DIAGNOSTIC DATA (LABS, IMAGING, TESTING) - I reviewed patient records, labs, notes, testing and imaging myself where available.  Lab Results  Component Value Date   WBC 5.4 12/22/2015   HGB 12.6 12/22/2015   HCT 36.6 12/22/2015   MCV 89.7 12/22/2015   PLT 347 12/22/2015      Component Value Date/Time   NA 136 12/22/2015 0932   K 4.0 12/22/2015 0932   CL 98 (L) 12/22/2015 0932   CO2 29  12/22/2015 0932   GLUCOSE 189 (H) 12/22/2015 0932   BUN 19 12/22/2015 0932   CREATININE 0.75 12/22/2015 0932   CALCIUM 9.7 12/22/2015 0932   PROT 8.7 (H) 12/22/2015 0932   ALBUMIN 4.1 12/22/2015 0932  AST 17 12/22/2015 0932   ALT 10 (L) 12/22/2015 0932   ALKPHOS 76 12/22/2015 0932   BILITOT 0.6 12/22/2015 0932   GFRNONAA >60 12/22/2015 0932   GFRAA >60 12/22/2015 0932       ASSESSMENT AND PLAN  Demyelinating disease of central nervous system, unspecified (HCC) - Plan: CBC with Differential/Platelet, Comprehensive metabolic panel  Diplopia  Ataxic gait  Loss of weight - Plan: CBC with Differential/Platelet, Comprehensive metabolic panel  Other headache syndrome   The etiology of her symptoms is not clear, but she might have 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  1.   Prednisone 20 mg po daily. 2.   Azathioprine 50 mg po qAM and 100 mg po qHS 3.   Oxybutynin for bladder.  She will return to see me in 2-3 months or sooner if there are new or worsening neurologic symptoms.  Erinn Huskins A. Epimenio FootSater, MD, PhD 04/18/2016, 1:46 PM Certified in Neurology, Clinical Neurophysiology, Sleep Medicine, Pain Medicine and Neuroimaging  [facility] [address], [address] [place], [place] [address] [phone]

## 2016-04-19 LAB — CBC WITH DIFFERENTIAL/PLATELET
BASOS: 0 %
Basophils Absolute: 0 10*3/uL (ref 0.0–0.2)
EOS (ABSOLUTE): 0.1 10*3/uL (ref 0.0–0.4)
EOS: 1 %
HEMOGLOBIN: 12 g/dL (ref 11.1–15.9)
Hematocrit: 35.2 % (ref 34.0–46.6)
IMMATURE GRANS (ABS): 0 10*3/uL (ref 0.0–0.1)
Immature Granulocytes: 0 %
LYMPHS: 48 %
Lymphocytes Absolute: 5 10*3/uL — ABNORMAL HIGH (ref 0.7–3.1)
MCH: 32.8 pg (ref 26.6–33.0)
MCHC: 34.1 g/dL (ref 31.5–35.7)
MCV: 96 fL (ref 79–97)
MONOCYTES: 5 %
Monocytes Absolute: 0.5 10*3/uL (ref 0.1–0.9)
NEUTROS ABS: 4.9 10*3/uL (ref 1.4–7.0)
Neutrophils: 46 %
Platelets: 345 10*3/uL (ref 150–379)
RBC: 3.66 x10E6/uL — ABNORMAL LOW (ref 3.77–5.28)
RDW: 15.6 % — ABNORMAL HIGH (ref 12.3–15.4)
WBC: 10.6 10*3/uL (ref 3.4–10.8)

## 2016-04-19 LAB — COMPREHENSIVE METABOLIC PANEL
A/G RATIO: 2 (ref 1.2–2.2)
ALBUMIN: 4.5 g/dL (ref 3.5–5.5)
ALT: 31 IU/L (ref 0–32)
AST: 22 IU/L (ref 0–40)
Alkaline Phosphatase: 73 IU/L (ref 39–117)
BILIRUBIN TOTAL: 0.3 mg/dL (ref 0.0–1.2)
BUN / CREAT RATIO: 14 (ref 9–23)
BUN: 9 mg/dL (ref 6–20)
CHLORIDE: 100 mmol/L (ref 96–106)
CO2: 27 mmol/L (ref 18–29)
Calcium: 9.1 mg/dL (ref 8.7–10.2)
Creatinine, Ser: 0.66 mg/dL (ref 0.57–1.00)
GFR, EST AFRICAN AMERICAN: 130 mL/min/{1.73_m2} (ref >59)
GFR, EST NON AFRICAN AMERICAN: 112 mL/min/{1.73_m2} (ref >59)
Globulin, Total: 2.2 g/dL (ref 1.5–4.5)
Glucose: 83 mg/dL (ref 65–99)
POTASSIUM: 3.8 mmol/L (ref 3.5–5.2)
Sodium: 141 mmol/L (ref 134–144)
TOTAL PROTEIN: 6.7 g/dL (ref 6.0–8.5)

## 2016-06-05 ENCOUNTER — Telehealth: Payer: Self-pay | Admitting: *Deleted

## 2016-06-05 NOTE — Telephone Encounter (Signed)
Pt form mailed on 06/05/16. Pt need to pay the fee.

## 2016-06-07 DIAGNOSIS — Z0289 Encounter for other administrative examinations: Secondary | ICD-10-CM

## 2016-08-28 ENCOUNTER — Ambulatory Visit: Payer: BLUE CROSS/BLUE SHIELD | Admitting: Neurology

## 2016-09-20 ENCOUNTER — Ambulatory Visit: Payer: BLUE CROSS/BLUE SHIELD | Admitting: Neurology

## 2016-09-20 DIAGNOSIS — Z0189 Encounter for other specified special examinations: Secondary | ICD-10-CM

## 2016-09-24 ENCOUNTER — Encounter: Payer: Self-pay | Admitting: Neurology

## 2016-10-04 DIAGNOSIS — Z0271 Encounter for disability determination: Secondary | ICD-10-CM

## 2016-10-23 ENCOUNTER — Ambulatory Visit (INDEPENDENT_AMBULATORY_CARE_PROVIDER_SITE_OTHER): Payer: BLUE CROSS/BLUE SHIELD | Admitting: Neurology

## 2016-10-23 ENCOUNTER — Encounter: Payer: Self-pay | Admitting: Neurology

## 2016-10-23 VITALS — BP 118/72 | HR 68 | Resp 18 | Ht 64.0 in | Wt 126.0 lb

## 2016-10-23 DIAGNOSIS — R260 Ataxic gait: Secondary | ICD-10-CM

## 2016-10-23 DIAGNOSIS — N39498 Other specified urinary incontinence: Secondary | ICD-10-CM

## 2016-10-23 DIAGNOSIS — R9089 Other abnormal findings on diagnostic imaging of central nervous system: Secondary | ICD-10-CM | POA: Diagnosis not present

## 2016-10-23 DIAGNOSIS — R4189 Other symptoms and signs involving cognitive functions and awareness: Secondary | ICD-10-CM | POA: Diagnosis not present

## 2016-10-23 DIAGNOSIS — G379 Demyelinating disease of central nervous system, unspecified: Secondary | ICD-10-CM | POA: Diagnosis not present

## 2016-10-23 MED ORDER — OXYBUTYNIN CHLORIDE 5 MG PO TABS: 5.0000 mg | ORAL_TABLET | Freq: Two times a day (BID) | ORAL | 5 refills | Status: DC

## 2016-10-23 MED ORDER — PREDNISONE 20 MG PO TABS: ORAL_TABLET | ORAL | 5 refills | Status: DC

## 2016-10-23 MED ORDER — AZATHIOPRINE 50 MG PO TABS: ORAL_TABLET | ORAL | 5 refills | Status: DC

## 2016-10-23 NOTE — Progress Notes (Signed)
[facility]  PATIENT: Michele Yu DOB: Jan 14, 1976  REFERRING DOCTOR OR PCP:  Phillips Odor, MD SOURCE: patient, EMR records, MRI images on PACS  _________________________________   HISTORICAL  CHIEF COMPLAINT:  Chief Complaint  Patient presents with  . Memory Loss    Here alone today.  Sts. memory and gait continue to decline.  Sts. she is compliant with Imuran, Prednisone and Valcyclovir/fim  . Gait Disturbance    HISTORY OF PRESENT ILLNESS:  Michele Yu is a 41 year old woman who has had neurologic symptoms associated with abnormal MRI with many enhancing foci starting November 2016. She has cognitive difficulties as well as poor balance, but she is better than early 2017.Marland Kitchen   Her headache is also better.   Due to physical and cognitive issues, she is unable to work.    Her last MRI was 01/26/2016 and showed some progression compared to 09/03/2015.    He was placed on Imuran and prednisone. She tolerated it but stopped both medicines last month due to expense. We discussed the importance of staying on therapy.  Cognitive:   She has trouble with focus and attention but is not at baseline.   She also notes reduced Short term memory and executive function.   Mood is better than last year but better than a year ago.    Gait/balance/strength/sensation:   Gait is poor due to poor balance. But no falls    She no longer veers with walking.   She can walk without a cane but is safer when she uses one.   She denies any numbness.   Her arms feel fine.      Vision:    She has no more blurred or double vision.  Bladder:   Bladder function is still a problem and she has incontinence, worse at night.    She has no hesitancy.    Bowel is fine.  Headache is much better since starting prednisone.  Her last MRI showed multiple foci, with many in the pons and middle cerebellar peduncles. Some foci enhanced. Many of these foci were not present on her earlier MRIs. An MRI she had  about a month after her initial MRI (that showed enhancing lesions) no significant improvement and this third MRI is definitely worse. The etiology is uncertain. The pattern is not really typical for MS.   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  This could certainly explain her rapid improvement both time she has received IV steroids.   History of diplopia/numbness:    She presented with diplopia and right facial numbness in late November 2016.  She was admitted  and received 5 days of IV steroids. While she was in the hospital, she did have urinary incontinence.  She had one or 2 more episodes after she was discharged. She did not note any major cognitive dysfunction while she was in the hospital. However, she thinks that she was a little bit slowed in her thoughts.  While there, she had an MRI of the brain that showed a white matter lesion in the right side of the mid brain to the adjacent thalamus and also some patchy white matter changes in the hemispheres. There was patchy enhancement of some of these foci.   MRI of the cervical spinal cord did not show any demyelinating lesions. She did have mild degenerative changes that were unlikely to be playing a role. I reviewed these MRIs and concur with the official interpretation.  A lumbar puncture was performed. The cerebrospinal fluid did not show elevated IgG nor oligoclonal bands. However, she did have increased number of white blood cells (175), with a pleocytosis with lymphocytes > neutrophils.     By the time she was discharged, the facial numbness had resolved and the diplopia was almost completely resolved.   MRI and lab History:  MRI late November 2016 was abnormal and showed a focus in the right pons and a few other patchy enhancing foci elsewhere. She improved significantly with steroids. A repeat MRI in January 2017 showed that the pontine lesion resolved though the  other foci were still present but not enhancing. Lumbar puncture did not show oligoclonal bands though there was a pleocytosis. Lupus anticoagulant was mildly abnormal.   Additionally I checked ANA, ANCA and they were normal.    Repeat MRI June 2017 showed several foci not present on earlier MRIs including some that enhanced in the posterior fossa.    REVIEW OF SYSTEMS: Constitutional: No fevers, chills, sweats, or change in appetite Eyes: No visual changes, double vision, eye pain Ear, nose and throat: No hearing loss, ear pain, nasal congestion, sore throat Cardiovascular: No chest pain, palpitations Respiratory: No shortness of breath at rest or with exertion.   No wheezes GastrointestinaI: No nausea, vomiting, diarrhea, abdominal pain, fecal incontinence Genitourinary: No dysuria, urinary retention or frequency.  No nocturia. Musculoskeletal: No neck pain, back pain Integumentary: No rash, pruritus, skin lesions Neurological: as above uPsychiatric: No depression at this time.  No anxiety Endocrine: No palpitations, diaphoresis, change in appetite, change in weigh or increased thirst Hematologic/Lymphatic: No anemia, purpura, petechiae. Allergic/Immunologic: No itchy/runny eyes, nasal congestion, recent allergic reactions, rashes  ALLERGIES: No Known Allergies  HOME MEDICATIONS:  Current Outpatient Prescriptions:  .  azaTHIOprine (IMURAN) 50 MG tablet, One po in am and two po at night, Disp: 90 tablet, Rfl: 5 .  predniSONE (DELTASONE) 20 MG tablet, Take as directed, Disp: 100 tablet, Rfl: 5 .  valACYclovir (VALTREX) 500 MG tablet, Take 500 mg by mouth daily., Disp: , Rfl:  .  oxybutynin (DITROPAN) 5 MG tablet, Take 1 tablet (5 mg total) by mouth 2 (two) times daily., Disp: 60 tablet, Rfl: 5 No current facility-administered medications for this visit.   Facility-Administered Medications Ordered in Other Visits:  .  gadopentetate dimeglumine (MAGNEVIST) injection 10 mL, 10 mL,  Intravenous, Once PRN, Asa Lente, MD  PAST MEDICAL HISTORY: Past Medical History:  Diagnosis Date  . Anxiety   . Daily headache   . Depression   . Genital HSV   . Hypertension 2003   "just right after I had my 1st baby"  . Panic attacks   . Stomach ulcer     PAST SURGICAL HISTORY: Past Surgical History:  Procedure Laterality Date  . APPENDECTOMY  1992  . DILATION AND CURETTAGE OF UTERUS     S/P "miscarriage"    FAMILY HISTORY: Family History  Problem Relation Age of Onset  . Heart attack Father     SOCIAL HISTORY:  Social History   Social History  . Marital status: Married    Spouse name: N/A  . Number of children: N/A  . Years of education: N/A   Occupational History  . Not on file.   Social History Main Topics  . Smoking status: Former Smoker    Packs/day: 1.00    Years: 19.00    Types: Cigarettes    Quit date: 01/19/2015  . Smokeless tobacco: Never Used  .  Alcohol use 8.4 oz/week    14 Glasses of wine per week     Comment: 08-03-15--sts. drinks 3-4 beers daily.  . Drug use: Yes    Types: Marijuana     Comment: 07/20/2015 "I've experimented w/marijuana"  . Sexual activity: Yes    Birth control/ protection: Condom, Spermicide   Other Topics Concern  . Not on file   Social History Narrative  . No narrative on file     PHYSICAL EXAM  Vitals:   10/23/16 1445  BP: 118/72  Pulse: 68  Resp: 18  Weight: 126 lb (57.2 kg)  Height: 5\' 4"  (1.626 m)    Body mass index is 21.63 kg/m.   General: The patient is a thin woman in no acute distress  Neck:   She has normal range of motion. Appearance is normal. He is tender over the left posterior scalp and occiput.  Neurologic Exam  Mental status: The patient is alert and oriented x 3 at the time of the examination. The patient has apparent normal recent and remote memory, with an apparently normal attention span and concentration ability.   Speech is normal.  Cranial nerves: Extraocular  movements are full.  There is good facial sensation to soft touch bilaterally.  Facial strength is normal.  Trapezius and sternocleidomastoid strength is normal. No dysarthria is noted.  The tongue is midline, and the patient has symmetric elevation of the soft palate. No obvious hearing deficits are noted.  Motor:  Muscle bulk is normal.   Tone is normal. Strength is  5 / 5 in all 4 extremities.   Sensory: Sensory testing is intact to touch and vibration in arms and legs.  Coordination: She has normal right but mildly reduced left finger nose finger testing and reduced left HTS   Gait and station: Station is normal.   The gait is ataxic. She is able to walk without a cane. Her tandem walk is wide. . Romberg is borderline.   Reflexes: Deep tendon reflexes are increased in legs with spread at her knees     DIAGNOSTIC DATA (LABS, IMAGING, TESTING) - I reviewed patient records, labs, notes, testing and imaging myself where available.  Lab Results  Component Value Date   WBC 10.6 04/18/2016   HGB 12.6 12/22/2015   HCT 35.2 04/18/2016   MCV 96 04/18/2016   PLT 345 04/18/2016      Component Value Date/Time   NA 141 04/18/2016 1352   K 3.8 04/18/2016 1352   CL 100 04/18/2016 1352   CO2 27 04/18/2016 1352   GLUCOSE 83 04/18/2016 1352   GLUCOSE 189 (H) 12/22/2015 0932   BUN 9 04/18/2016 1352   CREATININE 0.66 04/18/2016 1352   CALCIUM 9.1 04/18/2016 1352   PROT 6.7 04/18/2016 1352   ALBUMIN 4.5 04/18/2016 1352   AST 22 04/18/2016 1352   ALT 31 04/18/2016 1352   ALKPHOS 73 04/18/2016 1352   BILITOT 0.3 04/18/2016 1352   GFRNONAA 112 04/18/2016 1352   GFRAA 130 04/18/2016 1352       ASSESSMENT AND PLAN  Demyelinating disease of central nervous system (HCC) - Plan: MR BRAIN W WO CONTRAST, CBC with Differential/Platelet, Comprehensive metabolic panel  Ataxic gait - Plan: MR BRAIN W WO CONTRAST  Disturbed cognition  Abnormal brain MRI - Plan: MR BRAIN W WO CONTRAST, CBC  with Differential/Platelet, Comprehensive metabolic panel  Other urinary incontinence   She has had abnormal MRIs the consistent with vasculitis or CLIPPERS (Chronic lymphocytic inflammation with  pontine perivascular enhancement responsive to steroids).  MS is less likely.  1.   Prednisone 20 mg po daily and  Azathioprine 50 mg po qAM and 100 mg po qHS 2.    MRI of the brain with and without contrast to evaluate for breakthrough disease that may require [place] to use a different immunomodulator 3.   Oxybutynin for bladder.  She will return to see me in 2-3 months or sooner if there are new or worsening neurologic symptoms.  Richard A. Epimenio Foot, MD, PhD 10/23/2016, 3:29 PM Certified in Neurology, Clinical Neurophysiology, Sleep Medicine, Pain Medicine and Neuroimaging  [facility] [address] [place], [place] [address] [phone]

## 2016-10-31 ENCOUNTER — Ambulatory Visit (INDEPENDENT_AMBULATORY_CARE_PROVIDER_SITE_OTHER): Payer: BLUE CROSS/BLUE SHIELD

## 2016-10-31 DIAGNOSIS — R9089 Other abnormal findings on diagnostic imaging of central nervous system: Secondary | ICD-10-CM

## 2016-10-31 DIAGNOSIS — R260 Ataxic gait: Secondary | ICD-10-CM

## 2016-10-31 DIAGNOSIS — G379 Demyelinating disease of central nervous system, unspecified: Secondary | ICD-10-CM | POA: Diagnosis not present

## 2016-10-31 MED ORDER — GADOPENTETATE DIMEGLUMINE 469.01 MG/ML IV SOLN: 10.0000 mL | Freq: Once | INTRAVENOUS | Status: AC | PRN

## 2016-11-07 ENCOUNTER — Telehealth: Payer: Self-pay | Admitting: *Deleted

## 2016-11-07 NOTE — Telephone Encounter (Signed)
-----   Message from Asa Lente, MD sent at 11/05/2016  5:24 PM EDT ----- Please let her know that the MRI of the brain looks a little bit better than the one she had last summer..   No new lesions.

## 2016-11-07 NOTE — Telephone Encounter (Signed)
I have spoken with husband Ethelene Browns this afternoon and per RAS, reviewed MRI results as below.  He verbalized understanding of same, was glad to get good news./fim

## 2016-11-07 NOTE — Telephone Encounter (Signed)
Disability Paperwork completed, sent up front to Med. Records so that pt. can be called, form fee collected/fim

## 2016-11-26 DIAGNOSIS — Z0289 Encounter for other administrative examinations: Secondary | ICD-10-CM

## 2017-02-26 ENCOUNTER — Encounter: Payer: Self-pay | Admitting: Neurology

## 2017-02-26 ENCOUNTER — Ambulatory Visit (INDEPENDENT_AMBULATORY_CARE_PROVIDER_SITE_OTHER): Payer: BLUE CROSS/BLUE SHIELD | Admitting: Neurology

## 2017-02-26 VITALS — BP 124/80 | HR 104 | Resp 16 | Ht 64.0 in | Wt 151.5 lb

## 2017-02-26 DIAGNOSIS — G43709 Chronic migraine without aura, not intractable, without status migrainosus: Secondary | ICD-10-CM | POA: Diagnosis not present

## 2017-02-26 DIAGNOSIS — R260 Ataxic gait: Secondary | ICD-10-CM | POA: Diagnosis not present

## 2017-02-26 DIAGNOSIS — R4189 Other symptoms and signs involving cognitive functions and awareness: Secondary | ICD-10-CM

## 2017-02-26 DIAGNOSIS — R9089 Other abnormal findings on diagnostic imaging of central nervous system: Secondary | ICD-10-CM

## 2017-02-26 DIAGNOSIS — R454 Irritability and anger: Secondary | ICD-10-CM | POA: Diagnosis not present

## 2017-02-26 DIAGNOSIS — G379 Demyelinating disease of central nervous system, unspecified: Secondary | ICD-10-CM | POA: Diagnosis not present

## 2017-02-26 MED ORDER — PREDNISONE 20 MG PO TABS: 20.0000 mg | ORAL_TABLET | Freq: Every day | ORAL | 3 refills | Status: DC

## 2017-02-26 MED ORDER — AZATHIOPRINE 50 MG PO TABS: ORAL_TABLET | ORAL | 11 refills | Status: DC

## 2017-02-26 MED ORDER — OXYBUTYNIN CHLORIDE 5 MG PO TABS: 5.0000 mg | ORAL_TABLET | Freq: Two times a day (BID) | ORAL | 3 refills | Status: DC

## 2017-02-26 NOTE — Progress Notes (Signed)
[facility]  PATIENT: Michele Yu DOB: 1976-06-30  REFERRING DOCTOR OR PCP:  Phillips Odor, MD SOURCE: patient, EMR records, MRI images on PACS  _________________________________   HISTORICAL  CHIEF COMPLAINT:  Chief Complaint  Patient presents with  . Abnormal MRI    Sts. has not taken any of her meds since March, due to cost.  She has ins., but the combined copays for Imuran, Ditropan, Prednosone are around $135.  She is only taking Valtrex.  Sts. she feels about the same.  Husband sts. memory is about the same, mood is about the same (argumentative), balance is some better  She has been denied disability twice, would like to discuss going back to work/fim  . Memory Loss  . Gait Disturbance    HISTORY OF PRESENT ILLNESS:  Michele Yu is a 41 year old woman who has had neurologic symptoms associated with abnormal MRI with many enhancing foci starting November 2016. Cognition, balance and other neurologic issues were worse in early 2017 but she used to have difficulties with these neurologic issues.    Due to physical and cognitive issues, she is unable to work.    Her last MRI was 11/02/16 and compared to 01/26/2016 showed no new lesions and continued abnormal foci in the hemispheres and brainstem,  She was placed on Imuran and prednisone. She tolerated it but stopped both medicines last month due to expense. We discussed the importance of staying on therapy.  Cognitive:   She has trouble with focus and attention.   She also has continued difficulty with short-term memory and executive function. She has trouble following complicated instructions..   Mood is better than last year but she is still irritable  Gait/balance/strength/sensation:   Gait is poor. She stumbles frequently.  She can walk without a cane but is safer when she uses one.   She denies any numbness.   Her arms feel fine.      Vision:    She has no more blurred or double vision.  Bladder:    Bladder function is still a problem and she has incontinence, worse at night.   She cannot afford the oxybutynin. She has no hesitancy.    Bowel is fine.  Headache is much better since starting prednisone.  MRI showed multiple foci, with many in the pons and middle cerebellar peduncles. Some foci enhanced. Many of these foci were not present on her earlier MRIs. An MRI she had about a month after her initial MRI (that showed enhancing lesions) no significant improvement and this third MRI is definitely worse. The etiology is uncertain. The pattern is not really typical for MS.   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  This could certainly explain her rapid improvement both time she has received IV steroids.   History of diplopia/numbness:    She presented with diplopia and right facial numbness in late November 2016.  She was admitted  and received 5 days of IV steroids. While she was in the hospital, she did have urinary incontinence.  She had one or 2 more episodes after she was discharged. She did not note any major cognitive dysfunction while she was in the hospital. However, she thinks that she was a little bit slowed in her thoughts.  While there, she had an MRI of the brain that showed a white matter lesion in the right side of the mid brain to the adjacent thalamus and also some patchy white matter  changes in the hemispheres. There was patchy enhancement of some of these foci.   MRI of the cervical spinal cord did not show any demyelinating lesions. She did have mild degenerative changes that were unlikely to be playing a role. I reviewed these MRIs and concur with the official interpretation. A lumbar puncture was performed. The cerebrospinal fluid did not show elevated IgG nor oligoclonal bands. However, she did have increased number of white blood cells (175), with a pleocytosis with lymphocytes > neutrophils.     By  the time she was discharged, the facial numbness had resolved and the diplopia was almost completely resolved.   MRI and lab History:  MRI late November 2016 was abnormal and showed a focus in the right pons and a few other patchy enhancing foci elsewhere. She improved significantly with steroids. A repeat MRI in January 2017 showed that the pontine lesion resolved though the other foci were still present but not enhancing. Lumbar puncture did not show oligoclonal bands though there was a pleocytosis. Lupus anticoagulant was mildly abnormal.   Additionally I checked ANA, ANCA and they were normal.    Repeat MRI June 2017 showed several foci not present on earlier MRIs including some that enhanced in the posterior fossa.    REVIEW OF SYSTEMS: Constitutional: No fevers, chills, sweats, or change in appetite Eyes: No visual changes, double vision, eye pain Ear, nose and throat: No hearing loss, ear pain, nasal congestion, sore throat Cardiovascular: No chest pain, palpitations Respiratory: No shortness of breath at rest or with exertion.   No wheezes GastrointestinaI: No nausea, vomiting, diarrhea, abdominal pain, fecal incontinence Genitourinary: No dysuria, urinary retention or frequency.  No nocturia. Musculoskeletal: No neck pain, back pain Integumentary: No rash, pruritus, skin lesions Neurological: as above uPsychiatric: No depression at this time.  No anxiety Endocrine: No palpitations, diaphoresis, change in appetite, change in weigh or increased thirst Hematologic/Lymphatic: No anemia, purpura, petechiae. Allergic/Immunologic: No itchy/runny eyes, nasal congestion, recent allergic reactions, rashes  ALLERGIES: No Known Allergies  HOME MEDICATIONS:  Current Outpatient Prescriptions:  .  azaTHIOprine (IMURAN) 50 MG tablet, One po in am and two po at night (Patient not taking: Reported on 02/26/2017), Disp: 90 tablet, Rfl: 5 .  oxybutynin (DITROPAN) 5 MG tablet, Take 1 tablet (5 mg  total) by mouth 2 (two) times daily. (Patient not taking: Reported on 02/26/2017), Disp: 60 tablet, Rfl: 5 .  predniSONE (DELTASONE) 20 MG tablet, Take as directed (Patient not taking: Reported on 02/26/2017), Disp: 100 tablet, Rfl: 5 .  valACYclovir (VALTREX) 500 MG tablet, Take 500 mg by mouth daily., Disp: , Rfl:  No current facility-administered medications for this visit.   Facility-Administered Medications Ordered in Other Visits:  .  gadopentetate dimeglumine (MAGNEVIST) injection 10 mL, 10 mL, Intravenous, Once PRN, Aarohi Redditt A, MD .  gadopentetate dimeglumine (MAGNEVIST) injection 10 mL, 10 mL, Intravenous, Once PRN, Venisa Frampton, Pearletha Furl, MD  PAST MEDICAL HISTORY: Past Medical History:  Diagnosis Date  . Anxiety   . Daily headache   . Depression   . Genital HSV   . Hypertension 2003   "just right after I had my 1st baby"  . Panic attacks   . Stomach ulcer     PAST SURGICAL HISTORY: Past Surgical History:  Procedure Laterality Date  . APPENDECTOMY  1992  . DILATION AND CURETTAGE OF UTERUS     S/P "miscarriage"    FAMILY HISTORY: Family History  Problem Relation Age of Onset  . Heart attack  Father     SOCIAL HISTORY:  Social History   Social History  . Marital status: Married    Spouse name: N/A  . Number of children: N/A  . Years of education: N/A   Occupational History  . Not on file.   Social History Main Topics  . Smoking status: Former Smoker    Packs/day: 1.00    Years: 19.00    Types: Cigarettes    Quit date: 01/19/2015  . Smokeless tobacco: Never Used  . Alcohol use 8.4 oz/week    14 Glasses of wine per week     Comment: 08-03-15--sts. drinks 3-4 beers daily.  . Drug use: Yes    Types: Marijuana     Comment: 07/20/2015 "I've experimented w/marijuana"  . Sexual activity: Yes    Birth control/ protection: Condom, Spermicide   Other Topics Concern  . Not on file   Social History Narrative  . No narrative on file     PHYSICAL  EXAM  Vitals:   02/26/17 1511  BP: 124/80  Pulse: (!) 104  Resp: 16  Weight: 151 lb 8 oz (68.7 kg)  Height: 5\' 4"  (1.626 m)    Body mass index is 26 kg/m.   General: The patient is a thin woman in no acute distress  Neck:   She has normal range of motion.    Neurologic Exam  Mental status: The patient is alert and oriented x 3 at the time of the examination. The patient has apparent normal recent and remote memory, with an apparently normal attention span and concentration ability.   Speech is normal.  Cranial nerves: Extraocular movements are full.  Facial strength and sensation are normal  Trapezius and sternocleidomastoid strength is normal. No dysarthria is noted.  The tongue is midline, and the patient has symmetric elevation of the soft palate. No obvious hearing deficits are noted.  Motor:  Muscle bulk is normal.   Tone is normal. Strength is  5 / 5 in all 4 extremities.   Sensory: Sensory testing is intact to touch and vibration in arms and legs.  Coordination: She has reduced left finger-nose-finger testing and very reduced left Heel to shin difficulty and milder right HTS difficulty  Gait and station: Station is normal.   The gait is ataxic.   Her tandem walk is very wide. . Romberg is borderline.   Reflexes: Deep tendon reflexes are increased in legs with spread at her knees and nonsustained clonus at her ankles    DIAGNOSTIC DATA (LABS, IMAGING, TESTING) - I reviewed patient records, labs, notes, testing and imaging myself where available.  Lab Results  Component Value Date   WBC 10.6 04/18/2016   HGB 12.0 04/18/2016   HCT 35.2 04/18/2016   MCV 96 04/18/2016   PLT 345 04/18/2016      Component Value Date/Time   NA 141 04/18/2016 1352   K 3.8 04/18/2016 1352   CL 100 04/18/2016 1352   CO2 27 04/18/2016 1352   GLUCOSE 83 04/18/2016 1352   GLUCOSE 189 (H) 12/22/2015 0932   BUN 9 04/18/2016 1352   CREATININE 0.66 04/18/2016 1352   CALCIUM 9.1  04/18/2016 1352   PROT 6.7 04/18/2016 1352   ALBUMIN 4.5 04/18/2016 1352   AST 22 04/18/2016 1352   ALT 31 04/18/2016 1352   ALKPHOS 73 04/18/2016 1352   BILITOT 0.3 04/18/2016 1352   GFRNONAA 112 04/18/2016 1352   GFRAA 130 04/18/2016 1352       ASSESSMENT AND  PLAN  Demyelinating disease of central nervous system (HCC)  Chronic migraine w/o aura w/o status migrainosus, not intractable  Abnormal brain MRI  Disturbed cognition  Irritable  Ataxic gait   She has had abnormal MRIs the consistent with either CNS vasculitis or 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  MS is less likely but not completely ruled out.  1.   Prednisone 20 mg po daily and  Azathioprine 50 mg po qAM and 100 mg po qHS for her neuroimmunologic disorder.   She is having problems affording her med's and I checked on GoodRx[employer]com to help find the cheapest pharmacies.   2.   Oxybutynin for bladder.    She can take 5 mg once or twice a day. 3.    Try to stay active.   4.    Due to the combination of physical and cognitive impairment form her brain disorder she is permanently.   I will write a letter for them expressing this.    Will mail to the [facility].    She will return to see me in 4 months or sooner if there are new or worsening neurologic symptoms.  Elita Dame A. Epimenio Foot, MD, PhD 02/26/2017, 3:42 PM Certified in Neurology, Clinical Neurophysiology, Sleep Medicine, Pain Medicine and Neuroimaging  [facility] [address], [address] [place], [place] [address] [phone]  .

## 2017-07-02 ENCOUNTER — Encounter: Payer: Self-pay | Admitting: Neurology

## 2017-07-02 ENCOUNTER — Ambulatory Visit (INDEPENDENT_AMBULATORY_CARE_PROVIDER_SITE_OTHER): Payer: BLUE CROSS/BLUE SHIELD | Admitting: Neurology

## 2017-07-02 ENCOUNTER — Encounter (INDEPENDENT_AMBULATORY_CARE_PROVIDER_SITE_OTHER): Payer: Self-pay

## 2017-07-02 ENCOUNTER — Other Ambulatory Visit: Payer: Self-pay

## 2017-07-02 VITALS — BP 110/72 | HR 68 | Resp 16 | Ht 64.0 in | Wt 161.0 lb

## 2017-07-02 DIAGNOSIS — R9089 Other abnormal findings on diagnostic imaging of central nervous system: Secondary | ICD-10-CM | POA: Diagnosis not present

## 2017-07-02 DIAGNOSIS — G379 Demyelinating disease of central nervous system, unspecified: Secondary | ICD-10-CM | POA: Diagnosis not present

## 2017-07-02 DIAGNOSIS — R839 Unspecified abnormal finding in cerebrospinal fluid: Secondary | ICD-10-CM

## 2017-07-02 DIAGNOSIS — R260 Ataxic gait: Secondary | ICD-10-CM | POA: Diagnosis not present

## 2017-07-02 DIAGNOSIS — N39498 Other specified urinary incontinence: Secondary | ICD-10-CM

## 2017-07-02 DIAGNOSIS — R4189 Other symptoms and signs involving cognitive functions and awareness: Secondary | ICD-10-CM | POA: Diagnosis not present

## 2017-07-02 MED ORDER — AZATHIOPRINE 50 MG PO TABS: ORAL_TABLET | ORAL | 11 refills | Status: DC

## 2017-07-02 MED ORDER — PREDNISONE 20 MG PO TABS: 20.0000 mg | ORAL_TABLET | Freq: Every day | ORAL | 3 refills | Status: DC

## 2017-07-02 NOTE — Progress Notes (Signed)
[facility]  PATIENT: Michele Yu DOB: [birth year]  REFERRING DOCTOR OR PCP:  Phillips Odor, MD SOURCE: patient, EMR records, MRI images on PACS  _________________________________   HISTORICAL  CHIEF COMPLAINT:  Chief Complaint  Patient presents with  . Memory Loss    Sts. she is not taking Imuran, Ditropan, or Prednisone due to cost.  She is taking Valtrex.  She has [employer], sts. combined copays is over $100 and this is not affordable for her..  Thinks walking and memory are about the same/fim  . Gait Disturbance    HISTORY OF PRESENT ILLNESS:  Michele Yu is a 41 year old woman with abnormal MRI's and neurologic symptoms.  Update 07/02/2017:   She notes no new symptoms and continues to have difficulty with gait/balance and memory.    Headaches have resolved.    Gait is off sur to both weakness and balance.   She has no recent fall.   She does not note any arm numbness or weakness.  Cognitive issues include poor STM and poor focus and attention.   She needs to wear Depends due to bladder incontinence.  She denies any difficulty with her vision.       She has not been taking Imuran or prednisone x 4 months and not regularly x 6-7 months due to expense.   We discussed some strategies to reduce cost including using [employer]    She had lost a lot of weight but now is back at her original weight of around 160 pounds.      From 02/26/2017: who has had neurologic symptoms associated with abnormal MRI with many enhancing foci starting November 2016. Cognition, balance and other neurologic issues were worse in early 2017 but she used to have difficulties with these neurologic issues.    Due to physical and cognitive issues, she is unable to work.    Her last MRI was 11/02/16 and compared to 01/26/2016 showed no new lesions and continued abnormal foci in the hemispheres and brainstem,  She was placed on Imuran and prednisone. She tolerated it but stopped both medicines  last month due to expense. We discussed the importance of staying on therapy.  Cognitive:   She has trouble with focus and attention.   She also has continued difficulty with short-term memory and executive function. She has trouble following complicated instructions..   Mood is better than last year but she is still irritable  Gait/balance/strength/sensation:   Gait is poor. She stumbles frequently.  She can walk without a cane but is safer when she uses one.   She denies any numbness.   Her arms feel fine.      Vision:    She has no more blurred or double vision.  Bladder:   Bladder function is still a problem and she has incontinence, worse at night.   She cannot afford the oxybutynin. She has no hesitancy.    Bowel is fine.  Headache is much better since starting prednisone.  MRI showed multiple foci, with many in the pons and middle cerebellar peduncles. Some foci enhanced. Many of these foci were not present on her earlier MRIs. An MRI she had about a month after her initial MRI (that showed enhancing lesions) no significant improvement and this third MRI is definitely worse. The etiology is uncertain. The pattern is not really typical for MS.   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  This could certainly explain her rapid improvement  both time she has received IV steroids.   History of diplopia/numbness:    She presented with diplopia and right facial numbness in late November 2016.  She was admitted  and received 5 days of IV steroids. While she was in the hospital, she did have urinary incontinence.  She had one or 2 more episodes after she was discharged. She did not note any major cognitive dysfunction while she was in the hospital. However, she thinks that she was a little bit slowed in her thoughts.  While there, she had an MRI of the brain that showed a white matter lesion in the right side of the mid brain  to the adjacent thalamus and also some patchy white matter changes in the hemispheres. There was patchy enhancement of some of these foci.   MRI of the cervical spinal cord did not show any demyelinating lesions. She did have mild degenerative changes that were unlikely to be playing a role. I reviewed these MRIs and concur with the official interpretation. A lumbar puncture was performed. The cerebrospinal fluid did not show elevated IgG nor oligoclonal bands. However, she did have increased number of white blood cells (175), with a pleocytosis with lymphocytes > neutrophils.     By the time she was discharged, the facial numbness had resolved and the diplopia was almost completely resolved.   MRI and lab History:  MRI late November 2016 was abnormal and showed a focus in the right pons and a few other patchy enhancing foci elsewhere. She improved significantly with steroids. A repeat MRI in January 2017 showed that the pontine lesion resolved though the other foci were still present but not enhancing. Lumbar puncture did not show oligoclonal bands though there was a pleocytosis. Lupus anticoagulant was mildly abnormal.   Additionally I checked ANA, ANCA and they were normal.    Repeat MRI June 2017 showed several foci not present on earlier MRIs including some that enhanced in the posterior fossa.    REVIEW OF SYSTEMS: Constitutional: No fevers, chills, sweats, or change in appetite Eyes: No visual changes, double vision, eye pain Ear, nose and throat: No hearing loss, ear pain, nasal congestion, sore throat Cardiovascular: No chest pain, palpitations Respiratory: No shortness of breath at rest or with exertion.   No wheezes GastrointestinaI: No nausea, vomiting, diarrhea, abdominal pain, fecal incontinence Genitourinary: No dysuria, urinary retention or frequency.  No nocturia. Musculoskeletal: No neck pain, back pain Integumentary: No rash, pruritus, skin lesions Neurological: as  above uPsychiatric: No depression at this time.  No anxiety Endocrine: No palpitations, diaphoresis, change in appetite, change in weigh or increased thirst Hematologic/Lymphatic: No anemia, purpura, petechiae. Allergic/Immunologic: No itchy/runny eyes, nasal congestion, recent allergic reactions, rashes  ALLERGIES: No Known Allergies  HOME MEDICATIONS:  Current Outpatient Medications:  .  valACYclovir (VALTREX) 500 MG tablet, Take 500 mg by mouth daily., Disp: , Rfl:  .  azaTHIOprine (IMURAN) 50 MG tablet, One po in am and two po at night, Disp: 90 tablet, Rfl: 11 .  oxybutynin (DITROPAN) 5 MG tablet, Take 1 tablet (5 mg total) by mouth 2 (two) times daily. (Patient not taking: Reported on 07/02/2017), Disp: 180 tablet, Rfl: 3 .  predniSONE (DELTASONE) 20 MG tablet, Take 1 tablet (20 mg total) daily with breakfast by mouth., Disp: 90 tablet, Rfl: 3 No current facility-administered medications for this visit.   Facility-Administered Medications Ordered in Other Visits:  .  gadopentetate dimeglumine (MAGNEVIST) injection 10 mL, 10 mL, Intravenous, Once PRN, Epimenio Foot, Jonny Longino  A, MD .  gadopentetate dimeglumine (MAGNEVIST) injection 10 mL, 10 mL, Intravenous, Once PRN, Kylia Grajales, Pearletha Furlichard A, MD  PAST MEDICAL HISTORY: Past Medical History:  Diagnosis Date  . Anxiety   . Daily headache   . Depression   . Genital HSV   . Hypertension 2003   "just right after I had my 1st baby"  . Panic attacks   . Stomach ulcer     PAST SURGICAL HISTORY: Past Surgical History:  Procedure Laterality Date  . APPENDECTOMY  1992  . DILATION AND CURETTAGE OF UTERUS     S/P "miscarriage"    FAMILY HISTORY: Family History  Problem Relation Age of Onset  . Heart attack Father     SOCIAL HISTORY:  Social History   Socioeconomic History  . Marital status: Married    Spouse name: Not on file  . Number of children: Not on file  . Years of education: Not on file  . Highest education level: Not on file   Social Needs  . Financial resource strain: Not on file  . Food insecurity - worry: Not on file  . Food insecurity - inability: Not on file  . Transportation needs - medical: Not on file  . Transportation needs - non-medical: Not on file  Occupational History  . Not on file  Tobacco Use  . Smoking status: Former Smoker    Packs/day: 1.00    Years: 19.00    Pack years: 19.00    Types: Cigarettes    Last attempt to quit: 01/19/2015    Years since quitting: 2.4  . Smokeless tobacco: Never Used  Substance and Sexual Activity  . Alcohol use: Yes    Alcohol/week: 8.4 oz    Types: 14 Glasses of wine per week    Comment: 08-03-15--sts. drinks 3-4 beers daily.  . Drug use: Yes    Types: Marijuana    Comment: 07/20/2015 "I've experimented w/marijuana"  . Sexual activity: Yes    Birth control/protection: Condom, Spermicide  Other Topics Concern  . Not on file  Social History Narrative  . Not on file     PHYSICAL EXAM  Vitals:   07/02/17 1447  BP: 110/72  Pulse: 68  Resp: 16  Weight: 161 lb (73 kg)  Height: 5\' 4"  (1.626 m)    Body mass index is 27.64 kg/m.   General: The patient is a thin woman in no acute distress  Neck:   She has normal range of motion.    Neurologic Exam  Mental status: The patient is alert and oriented x 3 at the time of the examination. The patient has apparent normal recent and remote memory, with an apparently normal attention span and concentration ability.   Speech is normal.  Cranial nerves: Extraocular movements are full.  Facial strength and sensation are normal  Trapezius and sternocleidomastoid strength is normal. No dysarthria is noted.  The tongue is midline, and the patient has symmetric elevation of the soft palate. No obvious hearing deficits are noted.  Motor:  Muscle bulk is normal.   Tone is normal. Strength is  5 / 5 in all 4 extremities.   Sensory: She has intact sensation to touch and vibration..  Coordination: She has mildly  reduced educed left finger-nose-finger testing.  Left heel to shin is also mildly reduced.  Gait and station: Station is normal.   The gait is mildly ataxic.   Her tandem walk is wide. . Romberg is borderline.   Reflexes: Deep tendon reflexes  are increased in legs with spread at her knees and nonsustained clonus at her ankles    DIAGNOSTIC DATA (LABS, IMAGING, TESTING) - I reviewed patient records, labs, notes, testing and imaging myself where available.  Lab Results  Component Value Date   WBC 10.6 04/18/2016   HGB 12.0 04/18/2016   HCT 35.2 04/18/2016   MCV 96 04/18/2016   PLT 345 04/18/2016      Component Value Date/Time   NA 141 04/18/2016 1352   K 3.8 04/18/2016 1352   CL 100 04/18/2016 1352   CO2 27 04/18/2016 1352   GLUCOSE 83 04/18/2016 1352   GLUCOSE 189 (H) 12/22/2015 0932   BUN 9 04/18/2016 1352   CREATININE 0.66 04/18/2016 1352   CALCIUM 9.1 04/18/2016 1352   PROT 6.7 04/18/2016 1352   ALBUMIN 4.5 04/18/2016 1352   AST 22 04/18/2016 1352   ALT 31 04/18/2016 1352   ALKPHOS 73 04/18/2016 1352   BILITOT 0.3 04/18/2016 1352   GFRNONAA 112 04/18/2016 1352   GFRAA 130 04/18/2016 1352       ASSESSMENT AND PLAN  Demyelinating disease of central nervous system (HCC)  Abnormal brain MRI  Abnormal finding in CSF  Ataxic gait  Other urinary incontinence  Disturbed cognition   Etiology of abnormal MRI's not certain, could be either CNS vasculitis or 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  MS is less likely but not completely ruled out.  1.  We discussed getting back on Prednisone 20 mg po daily and  Azathioprine 50 mg po qAM and 100 mg po qHS for her neuroimmunologic disorder.   2.   Try to stay active.   3.    We will need to check a f/u MRI around the time of the next visit.    4.    She will return to see me in 4-5 months or sooner if there are new or worsening neurologic  symptoms.  Michele Yu A. Epimenio Foot, MD, PhD 07/02/2017, 3:29 PM Certified in Neurology, Clinical Neurophysiology, Sleep Medicine, Pain Medicine and Neuroimaging  [facility] [address], [address] [place], [place] [address] [phone]  .

## 2017-12-03 ENCOUNTER — Ambulatory Visit: Payer: BLUE CROSS/BLUE SHIELD | Admitting: Neurology

## 2017-12-04 ENCOUNTER — Encounter: Payer: Self-pay | Admitting: Neurology

## 2018-01-09 ENCOUNTER — Other Ambulatory Visit: Payer: Self-pay

## 2018-01-09 ENCOUNTER — Ambulatory Visit (INDEPENDENT_AMBULATORY_CARE_PROVIDER_SITE_OTHER): Payer: Medicaid Other | Admitting: Neurology

## 2018-01-09 ENCOUNTER — Encounter: Payer: Self-pay | Admitting: Neurology

## 2018-01-09 ENCOUNTER — Telehealth: Payer: Self-pay | Admitting: Neurology

## 2018-01-09 VITALS — BP 132/72 | HR 89 | Resp 16 | Ht 64.0 in | Wt 174.5 lb

## 2018-01-09 DIAGNOSIS — N39498 Other specified urinary incontinence: Secondary | ICD-10-CM | POA: Diagnosis not present

## 2018-01-09 DIAGNOSIS — R4189 Other symptoms and signs involving cognitive functions and awareness: Secondary | ICD-10-CM | POA: Diagnosis not present

## 2018-01-09 DIAGNOSIS — G379 Demyelinating disease of central nervous system, unspecified: Secondary | ICD-10-CM | POA: Diagnosis not present

## 2018-01-09 DIAGNOSIS — R260 Ataxic gait: Secondary | ICD-10-CM | POA: Diagnosis not present

## 2018-01-09 MED ORDER — AZATHIOPRINE 50 MG PO TABS: ORAL_TABLET | ORAL | 11 refills | Status: DC

## 2018-01-09 MED ORDER — OXYBUTYNIN CHLORIDE 5 MG PO TABS: 5.0000 mg | ORAL_TABLET | Freq: Two times a day (BID) | ORAL | 3 refills | Status: AC

## 2018-01-09 MED ORDER — PREDNISONE 20 MG PO TABS: 20.0000 mg | ORAL_TABLET | Freq: Every day | ORAL | 3 refills | Status: DC

## 2018-01-09 NOTE — Progress Notes (Signed)
[facility]  PATIENT: Michele Yu DOB: Apr 15, 1976  REFERRING DOCTOR OR PCP:  Phillips Odor, MD SOURCE: patient, EMR records, MRI images on PACS  _________________________________   HISTORICAL  CHIEF COMPLAINT:  Chief Complaint  Patient presents with  . Memory Loss    Sts. she has stopped all of her medications at least 6 mos. ago due to cost. Sts. she doesn't believe there is any change since stopping meds, but that her family would probably say there are changes.  She is here alone today/fim  . Gait Disturbance    HISTORY OF PRESENT ILLNESS:  Michele Yu is a 42 y.o.woman with abnormal MRI's and neurologic symptoms.  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  In 2017 she had multiple enhancing lesions, mostly in the infratentorial region.  She responded to steroids and Imuran.  She had been doing well about a year ago and an MRI showed resolution of many of the lesions and no enhancing lesions.  She does not have any lesions in her spinal cord.  Unfortunately, over the last year she has not taken her medicines much.  She stopped all of her medications due to the cost about 6 months ago but was taking it intermittently for least a few months before that..   She feels her walking is doing worse.   She is not needing to use a cane.  She needs to hold onto the wall or furniture at times.   Originally, she was experiencing a lot of headaches but she does not note many currently.       She has urinary urgency with incontinence daily.    She never filled the oxybutynin.   She wears Depends.  She has some cognitive issues.  Specifically she reports reduced short-term memory and poor focus and attention.  Update 07/02/2017:   She notes no new symptoms and continues to have difficulty with gait/balance and memory.    Headaches have resolved.    Gait is off sur to both weakness and balance.   She has no recent fall.   She does not note any  arm numbness or weakness.  Cognitive issues include poor STM and poor focus and attention.   She needs to wear Depends due to bladder incontinence.  She denies any difficulty with her vision.       She has not been taking Imuran or prednisone x 4 months and not regularly x 6-7 months due to expense.   We discussed some strategies to reduce cost including using [employer]    She had lost a lot of weight but now is back at her original weight of around 160 pounds.      From 02/26/2017: who has had neurologic symptoms associated with abnormal MRI with many enhancing foci starting November 2016. Cognition, balance and other neurologic issues were worse in early 2017 but she used to have difficulties with these neurologic issues.    Due to physical and cognitive issues, she is unable to work.    Her last MRI was 11/02/16 and compared to 01/26/2016 showed no new lesions and continued abnormal foci in the hemispheres and brainstem,  She was placed on Imuran and prednisone. She tolerated it but stopped both medicines last month due to expense. We discussed the importance of staying on therapy.  Cognitive:   She has trouble with focus and attention.   She also has continued difficulty with short-term memory and executive function. She has trouble following complicated instructions.Marland Kitchen  Mood is better than last year but she is still irritable  Gait/balance/strength/sensation:   Gait is poor. She stumbles frequently.  She can walk without a cane but is safer when she uses one.   She denies any numbness.   Her arms feel fine.      Vision:    She has no more blurred or double vision.  Bladder:   Bladder function is still a problem and she has incontinence, worse at night.   She cannot afford the oxybutynin. She has no hesitancy.    Bowel is fine.  Headache is much better since starting prednisone.  MRI showed multiple foci, with many in the pons and middle cerebellar peduncles. Some foci enhanced. Many of these  foci were not present on her earlier MRIs. An MRI she had about a month after her initial MRI (that showed enhancing lesions) no significant improvement and this third MRI is definitely worse. The etiology is uncertain. The pattern is not really typical for MS.   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  This could certainly explain her rapid improvement both time she has received IV steroids.   History of diplopia/numbness:    She presented with diplopia and right facial numbness in late November 2016.  She was admitted  and received 5 days of IV steroids. While she was in the hospital, she did have urinary incontinence.  She had one or 2 more episodes after she was discharged. She did not note any major cognitive dysfunction while she was in the hospital. However, she thinks that she was a little bit slowed in her thoughts.  While there, she had an MRI of the brain that showed a white matter lesion in the right side of the mid brain to the adjacent thalamus and also some patchy white matter changes in the hemispheres. There was patchy enhancement of some of these foci.   MRI of the cervical spinal cord did not show any demyelinating lesions. She did have mild degenerative changes that were unlikely to be playing a role. I reviewed these MRIs and concur with the official interpretation. A lumbar puncture was performed. The cerebrospinal fluid did not show elevated IgG nor oligoclonal bands. However, she did have increased number of white blood cells (175), with a pleocytosis with lymphocytes > neutrophils.     By the time she was discharged, the facial numbness had resolved and the diplopia was almost completely resolved.   MRI and lab History:  MRI late November 2016 was abnormal and showed a focus in the right pons and a few other patchy enhancing foci elsewhere. She improved significantly with steroids. A repeat MRI in January  2017 showed that the pontine lesion resolved though the other foci were still present but not enhancing. Lumbar puncture did not show oligoclonal bands though there was a pleocytosis. Lupus anticoagulant was mildly abnormal.   Additionally I checked ANA, ANCA and they were normal.    Repeat MRI June 2017 showed several foci not present on earlier MRIs including some that enhanced in the posterior fossa.    REVIEW OF SYSTEMS: Constitutional: No fevers, chills, sweats, or change in appetite Eyes: No visual changes, double vision, eye pain Ear, nose and throat: No hearing loss, ear pain, nasal congestion, sore throat Cardiovascular: No chest pain, palpitations Respiratory: No shortness of breath at rest or with exertion.   No wheezes GastrointestinaI: No nausea, vomiting, diarrhea, abdominal pain, fecal incontinence Genitourinary: No  dysuria, urinary retention or frequency.  No nocturia. Musculoskeletal: No neck pain, back pain Integumentary: No rash, pruritus, skin lesions Neurological: as above uPsychiatric: No depression at this time.  No anxiety Endocrine: No palpitations, diaphoresis, change in appetite, change in weigh or increased thirst Hematologic/Lymphatic: No anemia, purpura, petechiae. Allergic/Immunologic: No itchy/runny eyes, nasal congestion, recent allergic reactions, rashes  ALLERGIES: No Known Allergies  HOME MEDICATIONS:  Current Outpatient Medications:  .  azaTHIOprine (IMURAN) 50 MG tablet, One po qAM and two po qHS, Disp: 90 tablet, Rfl: 11 .  oxybutynin (DITROPAN) 5 MG tablet, Take 1 tablet (5 mg total) by mouth 2 (two) times daily., Disp: 180 tablet, Rfl: 3 .  predniSONE (DELTASONE) 20 MG tablet, Take 1 tablet (20 mg total) by mouth daily with breakfast., Disp: 90 tablet, Rfl: 3 .  valACYclovir (VALTREX) 500 MG tablet, Take 500 mg by mouth daily., Disp: , Rfl:  No current facility-administered medications for this visit.   Facility-Administered Medications  Ordered in Other Visits:  .  gadopentetate dimeglumine (MAGNEVIST) injection 10 mL, 10 mL, Intravenous, Once PRN, Sater, Richard A, MD .  gadopentetate dimeglumine (MAGNEVIST) injection 10 mL, 10 mL, Intravenous, Once PRN, Sater, Pearletha Furl, MD  PAST MEDICAL HISTORY: Past Medical History:  Diagnosis Date  . Anxiety   . Daily headache   . Depression   . Genital HSV   . Hypertension 2003   "just right after I had my 1st baby"  . Panic attacks   . Stomach ulcer     PAST SURGICAL HISTORY: Past Surgical History:  Procedure Laterality Date  . APPENDECTOMY  1992  . DILATION AND CURETTAGE OF UTERUS     S/P "miscarriage"    FAMILY HISTORY: Family History  Problem Relation Age of Onset  . Heart attack Father     SOCIAL HISTORY:  Social History   Socioeconomic History  . Marital status: Married    Spouse name: Not on file  . Number of children: Not on file  . Years of education: Not on file  . Highest education level: Not on file  Occupational History  . Not on file  Social Needs  . Financial resource strain: Not on file  . Food insecurity:    Worry: Not on file    Inability: Not on file  . Transportation needs:    Medical: Not on file    Non-medical: Not on file  Tobacco Use  . Smoking status: Former Smoker    Packs/day: 1.00    Years: 19.00    Pack years: 19.00    Types: Cigarettes    Last attempt to quit: 01/19/2015    Years since quitting: 2.9  . Smokeless tobacco: Never Used  Substance and Sexual Activity  . Alcohol use: Yes    Alcohol/week: 8.4 oz    Types: 14 Glasses of wine per week    Comment: 08-03-15--sts. drinks 3-4 beers daily.  . Drug use: Yes    Types: Marijuana    Comment: 07/20/2015 "I've experimented w/marijuana"  . Sexual activity: Yes    Birth control/protection: Condom, Spermicide  Lifestyle  . Physical activity:    Days per week: Not on file    Minutes per session: Not on file  . Stress: Not on file  Relationships  . Social  connections:    Talks on phone: Not on file    Gets together: Not on file    Attends religious service: Not on file    Active member of club or  organization: Not on file    Attends meetings of clubs or organizations: Not on file    Relationship status: Not on file  . Intimate partner violence:    Fear of current or ex partner: Not on file    Emotionally abused: Not on file    Physically abused: Not on file    Forced sexual activity: Not on file  Other Topics Concern  . Not on file  Social History Narrative  . Not on file     PHYSICAL EXAM  Vitals:   01/09/18 1134  BP: 132/72  Pulse: 89  Resp: 16  Weight: 174 lb 8 oz (79.2 kg)  Height: 5\' 4"  (1.626 m)    Body mass index is 29.95 kg/m.   General: The patient is a thin woman in no acute distress  Neck:   Range of motion is normal.  She is nontender.  Neurologic Exam  Mental status: The patient is alert and oriented x 3 at the time of the examination. The patient has apparent normal recent and remote memory, with an apparently normal attention span and concentration ability.   Speech is normal.  Cranial nerves: Extraocular movements are full.  Facial strength and sensation are normal  Trapezius and sternocleidomastoid strength is normal. No dysarthria is noted.  The tongue is midline, and the patient has symmetric elevation of the soft palate. No obvious hearing deficits are noted.  Motor:  Muscle bulk is normal.   Tone is normal. Strength is  5 / 5 in all 4 extremities.   Sensory: Intact sensation to touch and vibration in the arms and legs.  Coordination: She has mildly reduced educed left finger-nose-finger testing.  There is reduced left heel-to-shin.  Gait and station: Station is normal.  The gait is mildly ataxic and slightly wide.  The tandem gait is poor.. Romberg is borderline.   Reflexes: Deep tendon reflexes are increased in legs with spread at her knees and nonsustained clonus at her  ankles    DIAGNOSTIC DATA (LABS, IMAGING, TESTING) - I reviewed patient records, labs, notes, testing and imaging myself where available.  Lab Results  Component Value Date   WBC 10.6 04/18/2016   HGB 12.0 04/18/2016   HCT 35.2 04/18/2016   MCV 96 04/18/2016   PLT 345 04/18/2016      Component Value Date/Time   NA 141 04/18/2016 1352   K 3.8 04/18/2016 1352   CL 100 04/18/2016 1352   CO2 27 04/18/2016 1352   GLUCOSE 83 04/18/2016 1352   GLUCOSE 189 (H) 12/22/2015 0932   BUN 9 04/18/2016 1352   CREATININE 0.66 04/18/2016 1352   CALCIUM 9.1 04/18/2016 1352   PROT 6.7 04/18/2016 1352   ALBUMIN 4.5 04/18/2016 1352   AST 22 04/18/2016 1352   ALT 31 04/18/2016 1352   ALKPHOS 73 04/18/2016 1352   BILITOT 0.3 04/18/2016 1352   GFRNONAA 112 04/18/2016 1352   GFRAA 130 04/18/2016 1352       ASSESSMENT AND PLAN  Demyelinating disease of central nervous system (HCC) - Plan: MR BRAIN W WO CONTRAST  Ataxic gait - Plan: MR BRAIN W WO CONTRAST  Disturbed cognition - Plan: MR BRAIN W WO CONTRAST  Other urinary incontinence   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  MS is unlikely  1.  We discussed getting back on Prednisone 20 mg po daily and  Azathioprine 50 mg po qAM and  100 mg po qHS for her neuroimmunologic disorder.    She apparently has Medicaid so she should go to get the medications covered at a very low cost.  We also gave her a Good Rx card. 2.   Try to stay active.   3.   MRI of the brain with and without contrast to determine if there has been subclinical progression.    4.   She will return to see me in 5-6 months or sooner if there are new or worsening neurologic symptoms.  Richard A. Epimenio Foot, MD, PhD 01/09/2018, 12:38 PM Certified in Neurology, Clinical Neurophysiology, Sleep Medicine, Pain Medicine and Neuroimaging  [facility] [address], [address] [place], [place] [address] [phone]  .

## 2018-01-09 NOTE — Telephone Encounter (Signed)
Medicaid order sent to [facility]. They obtain the auth and they will reach out to the pt to schedule.

## 2018-01-20 ENCOUNTER — Emergency Department (HOSPITAL_COMMUNITY)
Admission: EM | Admit: 2018-01-20 | Discharge: 2018-01-20 | Disposition: A | Discharge status: Home / Self Care | Payer: Managed Care, Other (non HMO) | Attending: Emergency Medicine | Admitting: Emergency Medicine

## 2018-01-20 ENCOUNTER — Encounter (HOSPITAL_COMMUNITY): Payer: Self-pay

## 2018-01-20 ENCOUNTER — Ambulatory Visit
Admission: RE | Admit: 2018-01-20 | Discharge: 2018-01-20 | Disposition: A | Discharge status: Home / Self Care | Payer: Medicaid Other | Source: Ambulatory Visit | Attending: Neurology | Admitting: Neurology

## 2018-01-20 ENCOUNTER — Other Ambulatory Visit: Payer: Self-pay

## 2018-01-20 DIAGNOSIS — I10 Essential (primary) hypertension: Secondary | ICD-10-CM | POA: Diagnosis not present

## 2018-01-20 DIAGNOSIS — R260 Ataxic gait: Secondary | ICD-10-CM | POA: Diagnosis not present

## 2018-01-20 DIAGNOSIS — W57XXXA Bitten or stung by nonvenomous insect and other nonvenomous arthropods, initial encounter: Secondary | ICD-10-CM | POA: Diagnosis not present

## 2018-01-20 DIAGNOSIS — G379 Demyelinating disease of central nervous system, unspecified: Secondary | ICD-10-CM

## 2018-01-20 DIAGNOSIS — Z87891 Personal history of nicotine dependence: Secondary | ICD-10-CM | POA: Diagnosis not present

## 2018-01-20 DIAGNOSIS — L299 Pruritus, unspecified: Secondary | ICD-10-CM | POA: Diagnosis present

## 2018-01-20 DIAGNOSIS — R4189 Other symptoms and signs involving cognitive functions and awareness: Secondary | ICD-10-CM

## 2018-01-20 DIAGNOSIS — Z79899 Other long term (current) drug therapy: Secondary | ICD-10-CM | POA: Diagnosis not present

## 2018-01-20 MED ORDER — PERMETHRIN 5 % EX CREA: TOPICAL_CREAM | CUTANEOUS | 0 refills | Status: DC

## 2018-01-20 MED ORDER — GADOBENATE DIMEGLUMINE 529 MG/ML IV SOLN
16.0000 mL | Freq: Once | INTRAVENOUS | Status: AC | PRN
  Administered 2018-01-20: 16 mL via INTRAVENOUS

## 2018-01-20 MED ORDER — DIPHENHYDRAMINE HCL 25 MG PO TABS: 25.0000 mg | ORAL_TABLET | Freq: Four times a day (QID) | ORAL | 0 refills | Status: DC | PRN

## 2018-01-20 NOTE — Discharge Instructions (Signed)
Apply permethrin cream to entire body, wash off after 8 to 14 hours.  Repeat in 1 week if still symptomatic.  Take Benadryl every 6 hours as needed for itching.  Make sure to wash all affected clothing in hot water.  Please return to the emergency department if you develop any new or worsening symptoms.

## 2018-01-20 NOTE — ED Provider Notes (Signed)
[facility] EMERGENCY DEPARTMENT Provider Note   CSN: [id number] Arrival date & time: 01/20/18  1129     History   Chief Complaint Chief Complaint  Patient presents with  . Pruritis    HPI Michele Yu is a 42 y.o. female with history of anxiety, depression, panic attacks who presents with a 2 to 3-day history of generalized itching.  Patient reports she spent the night at a friend's house prior to onset and believes they had bedbugs, although she did not see any bedbugs.  She denies any difficulty breathing or pain.  She has been using over-the-counter hydrocortisone cream without significant relief.  Her itching is worse at night.  She denies itching between her fingers.  HPI  Past Medical History:  Diagnosis Date  . Anxiety   . Daily headache   . Depression   . Genital HSV   . Hypertension 2003   "just right after I had my 1st baby"  . Panic attacks   . Stomach ulcer     Patient Active Problem List   Diagnosis Date Noted  . Irritable 02/26/2017  . Other headache syndrome 03/07/2016  . Disturbed cognition 03/07/2016  . Urinary incontinence 01/20/2016  . Ataxic gait 12/23/2015  . 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 Loss of weight 12/23/2015  . Abnormal brain MRI   . Abnormal finding in CSF   . Diplopia   . 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    Past Surgical History:  Procedure Laterality Date  . APPENDECTOMY  1992  . DILATION AND CURETTAGE OF UTERUS     S/P "miscarriage"     OB History    Gravida  6   Para  2   Term      Preterm      AB  4   Living  2     SAB      TAB  3   Ectopic  1   Multiple      Live Births               Home Medications    Prior to Admission medications   Medication Sig Start Date End Date Taking? Authorizing Provider  azaTHIOprine (IMURAN) 50 MG tablet One po qAM and two po qHS 01/09/18   Sater, Pearletha Furl, MD    diphenhydrAMINE (BENADRYL) 25 MG tablet Take 1 tablet (25 mg total) by mouth every 6 (six) hours as needed for itching. 01/20/18   Sabeen Piechocki, Waylan Boga, PA-C  oxybutynin (DITROPAN) 5 MG tablet Take 1 tablet (5 mg total) by mouth 2 (two) times daily. 01/09/18   Sater, Pearletha Furl, MD  permethrin (ELIMITE) 5 % cream Apply to entire body, wash off after 8-14 hours. Repeat in 1 week if still symptomatic. 01/20/18   Island Dohmen, Waylan Boga, PA-C  predniSONE (DELTASONE) 20 MG tablet Take 1 tablet (20 mg total) by mouth daily with breakfast. 01/09/18   Sater, Pearletha Furl, MD  valACYclovir (VALTREX) 500 MG tablet Take 500 mg by mouth daily.    [provider]    Family History Family History  Problem Relation Age of Onset  . Heart attack Father     Social History Social History   Tobacco Use  . Smoking status: Former Smoker    Packs/day: 1.00    Years: 19.00    Pack years: 19.00    Types: Cigarettes    Last attempt to quit: 01/19/2015  Years since quitting: 3.0  . Smokeless tobacco: Never Used  Substance Use Topics  . Alcohol use: Yes    Alcohol/week: 8.4 oz    Types: 14 Glasses of wine per week    Comment: 08-03-15--sts. drinks 3-4 beers daily.  . Drug use: Yes    Types: Marijuana    Comment: 07/20/2015 "I've experimented w/marijuana"     Allergies   Patient has no known allergies.   Review of Systems Review of Systems  Constitutional: Negative for fever.  Respiratory: Negative for shortness of breath.   Skin: Positive for rash.     Physical Exam Updated Vital Signs BP 131/79 (BP Location: Right Arm)   Pulse 82   Temp 98.3 F (36.8 C) (Oral)   Resp 16   SpO2 100%   Physical Exam  Constitutional: She appears well-developed and well-nourished. No distress.  HENT:  Head: Normocephalic and atraumatic.  Mouth/Throat: Oropharynx is clear and moist. No oropharyngeal exudate.  No oral lesions  Eyes: Pupils are equal, round, and reactive to light. Conjunctivae are normal. Right  eye exhibits no discharge. Left eye exhibits no discharge. No scleral icterus.  Neck: Normal range of motion.  Cardiovascular: Normal rate, regular rhythm, normal heart sounds and intact distal pulses. Exam reveals no gallop and no friction rub.  No murmur heard. Pulmonary/Chest: Effort normal and breath sounds normal. No stridor. No respiratory distress. She has no wheezes. She has no rales.  Musculoskeletal: She exhibits no edema.  Neurological: She is alert. Coordination normal.  Skin: Skin is warm and dry. She is not diaphoretic. No pallor.  Several crusted appearing lesions on bilateral arms, nape of neck, nontender, no erythema or drainage Spares palms, no abnormalities in the webspaces  Psychiatric: She has a normal mood and affect.  Nursing note and vitals reviewed.    ED Treatments / Results  Labs (all labs ordered are listed, but only abnormal results are displayed) Labs Reviewed - No data to display  EKG None  Radiology No results found.  Procedures Procedures (including critical care time)  Medications Ordered in ED Medications - No data to display   Initial Impression / Assessment and Plan / ED Course  I have reviewed the triage vital signs and the nursing notes.  Pertinent labs & imaging results that were available during my care of the patient were reviewed by me and considered in my medical decision making (see chart for details).     Patient with suspected insect bites, concern for scabies considering appearance and generalized pruritis.  Will treat with permethrin and Benadryl.  No signs of secondary infection.  Patient advised to wash all affected clothing in hot water.  Return precautions discussed.  Patient understands and agrees with plan.  Patient vitals stable throughout ED course and discharged in satisfactory condition.  Final Clinical Impressions(s) / ED Diagnoses   Final diagnoses:  Pruritus  Insect bite, unspecified site, initial encounter     ED Discharge Orders        Ordered    permethrin (ELIMITE) 5 % cream     01/20/18 1417    diphenhydrAMINE (BENADRYL) 25 MG tablet  Every 6 hours PRN     01/20/18 1417       Kaymarie Wynn, Waylan Boga, PA-C 01/20/18 1421    Gerhard Munch, MD 01/20/18 1623

## 2018-01-20 NOTE — ED Triage Notes (Signed)
Pt presents for evaluation of itching to bilateral arms and back x 2-3 days. Reports spent the night at a friends house and is worried it may have been bed bugs. Denies seeing any bugs. Denies rash.

## 2018-01-22 ENCOUNTER — Telehealth: Payer: Self-pay | Admitting: *Deleted

## 2018-01-22 NOTE — Telephone Encounter (Signed)
-----   Message from Asa Lente, MD sent at 01/21/2018  4:42 PM EDT ----- Please let her know that the MRI of the brain did not show any new lesions.

## 2018-01-22 NOTE — Telephone Encounter (Signed)
Spoke with Michele Yu and reviewed below MRI results.  He will give pt. the message/fim

## 2018-02-08 ENCOUNTER — Encounter (HOSPITAL_COMMUNITY): Payer: Self-pay | Admitting: Emergency Medicine

## 2018-02-08 ENCOUNTER — Emergency Department (HOSPITAL_COMMUNITY)
Admission: EM | Admit: 2018-02-08 | Discharge: 2018-02-08 | Disposition: A | Discharge status: Home / Self Care | Payer: 59 | Attending: Emergency Medicine | Admitting: Emergency Medicine

## 2018-02-08 DIAGNOSIS — F1210 Cannabis abuse, uncomplicated: Secondary | ICD-10-CM

## 2018-02-08 DIAGNOSIS — F1220 Cannabis dependence, uncomplicated: Secondary | ICD-10-CM | POA: Diagnosis not present

## 2018-02-08 DIAGNOSIS — Z79899 Other long term (current) drug therapy: Secondary | ICD-10-CM | POA: Diagnosis not present

## 2018-02-08 DIAGNOSIS — F17210 Nicotine dependence, cigarettes, uncomplicated: Secondary | ICD-10-CM | POA: Diagnosis not present

## 2018-02-08 NOTE — ED Provider Notes (Signed)
[facility]-EMERGENCY DEPT Provider Note   CSN: [id number] Arrival date & time: 02/08/18  1401     History   Chief Complaint Chief Complaint  Patient presents with  . detox  . mental eval    HPI Michele Yu is a 42 y.o. female.  HPI She reports she wants help with treatment for marijuana abuse.  She states that she has been smoking marijuana daily for quite a while.  She reports it is in impacting her family life and daily life.  She reports that  due to her use of marijuana, she has had episodes of infidelity that are damaging her marriage.  She reports that 3 years ago she walked out on her job.  Denies other drug use or abuse.  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  She reports she gets treated by Dr. Epimenio Foot.  She reports that she does take her medication as prescribed and it has helped a lot.  She reports however since then the marijuana has seemed very helpful as well to controlling her symptoms. Past Medical History:  Diagnosis Date  . Anxiety   . Daily headache   . Depression   . Genital HSV   . Hypertension 2003   "just right after I had my 1st baby"  . Panic attacks   . Stomach ulcer     Patient Active Problem List   Diagnosis Date Noted  . Irritable 02/26/2017  . Other headache syndrome 03/07/2016  . Disturbed cognition 03/07/2016  . Urinary incontinence 01/20/2016  . Ataxic gait 12/23/2015  . 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 Loss of weight 12/23/2015  . Abnormal brain MRI   . Abnormal finding in CSF   . Diplopia   . 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    Past Surgical History:  Procedure Laterality Date  . APPENDECTOMY  1992  . DILATION AND CURETTAGE OF UTERUS     S/P "miscarriage"     OB History    Gravida  6   Para  2   Term      Preterm      AB  4   Living  2     SAB      TAB  3   Ectopic  1   Multiple      Live Births               Home Medications    Prior to Admission medications   Medication Sig Start Date End Date Taking? Authorizing Provider  azaTHIOprine (IMURAN) 50 MG tablet One po qAM and two po qHS 01/09/18   Sater, Pearletha Furl, MD  diphenhydrAMINE (BENADRYL) 25 MG tablet Take 1 tablet (25 mg total) by mouth every 6 (six) hours as needed for itching. 01/20/18   Law, Waylan Boga, PA-C  oxybutynin (DITROPAN) 5 MG tablet Take 1 tablet (5 mg total) by mouth 2 (two) times daily. 01/09/18   Sater, Pearletha Furl, MD  permethrin (ELIMITE) 5 % cream Apply to entire body, wash off after 8-14 hours. Repeat in 1 week if still symptomatic. 01/20/18   Law, Waylan Boga, PA-C  predniSONE (DELTASONE) 20 MG tablet Take 1 tablet (20 mg total) by mouth daily with breakfast. 01/09/18   Sater, Pearletha Furl, MD  valACYclovir (VALTREX) 500 MG tablet Take 500 mg by mouth daily.    [provider]  Family History Family History  Problem Relation Age of Onset  . Heart attack Father     Social History Social History   Tobacco Use  . Smoking status: Current Every Day Smoker    Packs/day: 1.00    Years: 19.00    Pack years: 19.00    Types: Cigarettes  . Smokeless tobacco: Never Used  Substance Use Topics  . Alcohol use: Yes    Alcohol/week: 8.4 oz    Types: 14 Glasses of wine per week    Comment: drinks 40oz beer daily.  . Drug use: Yes    Types: Marijuana     Allergies   Patient has no known allergies.   Review of Systems Review of Systems 10 Systems reviewed and are negative for acute change except as noted in the HPI.  Physical Exam Updated Vital Signs BP 124/74 (BP Location: Left Arm)   Pulse 100   Temp 98.6 F (37 C) (Oral)   Resp 17   SpO2 98%   Physical Exam  Constitutional: She is oriented to person, place, and time. She appears well-developed and well-nourished.  HENT:  Head:  Normocephalic and atraumatic.  Mouth/Throat: Oropharynx is clear and moist.  Eyes: Pupils are equal, round, and reactive to light. EOM are normal.  Neck: Neck supple.  Cardiovascular: Normal rate, regular rhythm, normal heart sounds and intact distal pulses.  Pulmonary/Chest: Effort normal and breath sounds normal.  Abdominal: Soft. Bowel sounds are normal. She exhibits no distension. There is no tenderness.  Musculoskeletal: Normal range of motion. She exhibits no edema.  Neurological: She is alert and oriented to person, place, and time. She has normal strength. She exhibits normal muscle tone. Coordination normal. GCS eye subscore is 4. GCS verbal subscore is 5. GCS motor subscore is 6.  Skin: Skin is warm, dry and intact.  Psychiatric: She has a normal mood and affect.     ED Treatments / Results  Labs (all labs ordered are listed, but only abnormal results are displayed) Labs Reviewed - No data to display  EKG None  Radiology No results found.  Procedures Procedures (including critical care time)  Medications Ordered in ED Medications - No data to display   Initial Impression / Assessment and Plan / ED Course  I have reviewed the triage vital signs and the nursing notes.  Pertinent labs & imaging results that were available during my care of the patient were reviewed by me and considered in my medical decision making (see chart for details).      Final Clinical Impressions(s) / ED Diagnoses   Final diagnoses:  Marijuana abuse, continuous   Patient is clinically well in appearance.  She is cooperative and interactive.  There does not appear to be any cognitive delay in normal interaction.  Patient does have insight into the problem of behaviors that are negatively impacting her family life.  She denies any positives on review of systems and describes her neurologic disorder, CLIPPERS, as well controlled at this time.  She however partly attributes this to the use of  marijuana.  Patient denies any homicidal or suicidal ideation.  She does not have demeanor of major depression.  At this time I have made suggestions of using resource guide materials for outpatient substance use treatment.  Also, I have stressed to the patient the importance of getting established with a family doctor to coordinate care for her in terms of her neurologic disorder, drug use and other basic medical care that needs a  coordinated approach.  She voices understanding. ED Discharge Orders    None       Arby Barrette, MD 02/08/18 1527

## 2018-02-08 NOTE — ED Notes (Signed)
Bed: [facility]PT3 Expected date:  Expected time:  Means of arrival:  Comments: 

## 2018-02-08 NOTE — ED Triage Notes (Signed)
Pt states that she has text messages from her husband stating that she needed to be seen at ED for detox from [facility] and mental evaluation or she is getting kicked out of their house.  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 40oz)/day, smokes THC and has issues with infidelity.   Pt denies SI or HI.

## 2018-04-30 ENCOUNTER — Telehealth: Payer: Self-pay | Admitting: Neurology

## 2018-04-30 NOTE — Telephone Encounter (Signed)
Patient requesting sooner apt than December for swelling in legs. Heading over to ER to get checked out today. Best call back is [phone]

## 2018-04-30 NOTE — Telephone Encounter (Signed)
Attempted to call pt. back but received message that vm is full.  If she calls back, please let her know that she should continue with eval by her family doctor.  Edema in legs is not usually neurologic in nature.  If pcp/ER feel she needs to be seen by Dr. Epimenio Foot for this, please call back/fim

## 2018-07-21 ENCOUNTER — Ambulatory Visit: Payer: BLUE CROSS/BLUE SHIELD | Admitting: Neurology

## 2018-09-09 ENCOUNTER — Telehealth: Payer: Self-pay | Admitting: Neurology

## 2018-09-09 ENCOUNTER — Other Ambulatory Visit: Payer: Self-pay

## 2018-09-09 ENCOUNTER — Encounter: Payer: Self-pay | Admitting: Neurology

## 2018-09-09 ENCOUNTER — Ambulatory Visit: Payer: Medicaid Other | Admitting: Neurology

## 2018-09-09 VITALS — BP 136/95 | HR 84 | Resp 18 | Ht 64.0 in | Wt 184.0 lb

## 2018-09-09 DIAGNOSIS — Z79899 Other long term (current) drug therapy: Secondary | ICD-10-CM | POA: Diagnosis not present

## 2018-09-09 DIAGNOSIS — E559 Vitamin D deficiency, unspecified: Secondary | ICD-10-CM

## 2018-09-09 DIAGNOSIS — R260 Ataxic gait: Secondary | ICD-10-CM

## 2018-09-09 DIAGNOSIS — G379 Demyelinating disease of central nervous system, unspecified: Secondary | ICD-10-CM

## 2018-09-09 DIAGNOSIS — R4189 Other symptoms and signs involving cognitive functions and awareness: Secondary | ICD-10-CM

## 2018-09-09 MED ORDER — AZATHIOPRINE 50 MG PO TABS
ORAL_TABLET | ORAL | 3 refills | Status: DC
Start: 2018-09-09 — End: 2019-03-10

## 2018-09-09 MED ORDER — BUPROPION HCL ER (XL) 300 MG PO TB24: 300.0000 mg | ORAL_TABLET | Freq: Every day | ORAL | 3 refills | Status: AC

## 2018-09-09 MED ORDER — PREDNISONE 10 MG PO TABS: 20.0000 mg | ORAL_TABLET | Freq: Every day | ORAL | 11 refills | Status: AC

## 2018-09-09 NOTE — Telephone Encounter (Signed)
Called [facility] and had rx's placed on hold: imuran, wellbutrin, prednisone sent by Dr. Epimenio Foot today.   Called [facility] And they pulled prescriptions through to their store.  I called pt to let her know. She verbalized understanding. She would like to only use [address]. I updated her list.

## 2018-09-09 NOTE — Telephone Encounter (Signed)
Pt is asking if RN can have all the prescriptions that were called in for her today can be sent to  [id number] - [place], [place] - [address]

## 2018-09-09 NOTE — Progress Notes (Signed)
[facility]  PATIENT: Michele Yu DOB: [birth year]  REFERRING DOCTOR OR PCP:  Phillips Odor, MD SOURCE: patient, EMR records, MRI images on PACS  _________________________________   HISTORICAL  CHIEF COMPLAINT:  Chief Complaint  Patient presents with  . Memory Loss    Rm. 13. Sts. she stopped Prednisone and Imuran about 6 wks. ago. Sts. the only med she is taking is Valtrex. Sts. is having more stiffness in legs, more fatigue./fim  . Gait Disturbance    HISTORY OF PRESENT ILLNESS:  Michele Yu is a 43 y.o.woman with abnormal MRI's and neurologic symptoms.  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    She felt she had stabilized on those meds but stopped both of them last month and now is feeling worse again but has no definite new neurologic symptom.    She has had a lot of stress and feels that may have played some role.  She feels a lot of apathy and was spending a lot of time laying down during the day.  She is both tired and sleepy  She is separated from her husband bu they are talking again.   She has been unable to work, partially due to her fatigue.   This has likely contributed to worse depression and worse self esteem.   She stopped azathioprine and prednisone last month and is only on Valtrex for herpes.    She had lost weight a couple ytears ago but now has gained her weight back.    She had some urinary urgency and some incontinence but now is better and stopped the oxybutynin.     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  In 2017 she had multiple enhancing lesions, mostly in the infratentorial region.  She responded to steroids and Imuran.  She had been doing well about a year ago and an MRI showed resolution of many of the lesions and no enhancing lesions.  She does not have any lesions in her spinal cord.   Unfortunately, over the last year she has not taken her medicines much.  She stopped all of her medications due to the cost about 6 months ago but was taking it intermittently for least a few months before that..   She feels her walking is doing worse.   She is not needing to use a cane.  She needs to hold onto the wall or furniture at times.   Originally, she was experiencing a lot of headaches but she does not note many currently.       She has urinary urgency with incontinence daily.    She never filled the oxybutynin.   She wears Depends.  She has some cognitive issues.  Specifically she reports reduced short-term memory and poor focus and attention.  Update 07/02/2017:   She notes no new symptoms and continues to have difficulty with gait/balance and memory.    Headaches have resolved.    Gait is off sur to both weakness and balance.   She has no recent fall.   She does not note any arm numbness or weakness.  Cognitive issues include poor STM and poor focus and attention.   She needs to wear Depends due to bladder incontinence.  She denies any difficulty with her vision.       She has not been taking Imuran or prednisone x 4 months and not regularly x 6-7 months due to  expense.   We discussed some strategies to reduce cost including using [employer]    She had lost a lot of weight but now is back at her original weight of around 160 pounds.      From 02/26/2017: who has had neurologic symptoms associated with abnormal MRI with many enhancing foci starting November 2016. Cognition, balance and other neurologic issues were worse in early 2017 but she used to have difficulties with these neurologic issues.    Due to physical and cognitive issues, she is unable to work.    Her last MRI was 11/02/16 and compared to 01/26/2016 showed no new lesions and continued abnormal foci in the hemispheres and brainstem,  She was placed on Imuran and prednisone. She tolerated it but stopped both medicines last month  due to expense. We discussed the importance of staying on therapy.  Cognitive:   She has trouble with focus and attention.   She also has continued difficulty with short-term memory and executive function. She has trouble following complicated instructions..   Mood is better than last year but she is still irritable  Gait/balance/strength/sensation:   Gait is poor. She stumbles frequently.  She can walk without a cane but is safer when she uses one.   She denies any numbness.   Her arms feel fine.      Vision:    She has no more blurred or double vision.  Bladder:   Bladder function is still a problem and she has incontinence, worse at night.   She cannot afford the oxybutynin. She has no hesitancy.    Bowel is fine.  Headache is much better since starting prednisone.  MRI showed multiple foci, with many in the pons and middle cerebellar peduncles. Some foci enhanced. Many of these foci were not present on her earlier MRIs. An MRI she had about a month after her initial MRI (that showed enhancing lesions) no significant improvement and this third MRI is definitely worse. The etiology is uncertain. The pattern is not really typical for MS.   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  This could certainly explain her rapid improvement both time she has received IV steroids.   History of diplopia/numbness:    She presented with diplopia and right facial numbness in late November 2016.  She was admitted  and received 5 days of IV steroids. While she was in the hospital, she did have urinary incontinence.  She had one or 2 more episodes after she was discharged. She did not note any major cognitive dysfunction while she was in the hospital. However, she thinks that she was a little bit slowed in her thoughts.  While there, she had an MRI of the brain that showed a white matter lesion in the right side of the mid brain to the  adjacent thalamus and also some patchy white matter changes in the hemispheres. There was patchy enhancement of some of these foci.   MRI of the cervical spinal cord did not show any demyelinating lesions. She did have mild degenerative changes that were unlikely to be playing a role. I reviewed these MRIs and concur with the official interpretation. A lumbar puncture was performed. The cerebrospinal fluid did not show elevated IgG nor oligoclonal bands. However, she did have increased number of white blood cells (175), with a pleocytosis with lymphocytes > neutrophils.     By the time she was discharged, the facial numbness had resolved and the  diplopia was almost completely resolved.   MRI and lab History:  MRI late November 2016 was abnormal and showed a focus in the right pons and a few other patchy enhancing foci elsewhere. She improved significantly with steroids. A repeat MRI in January 2017 showed that the pontine lesion resolved though the other foci were still present but not enhancing. Lumbar puncture did not show oligoclonal bands though there was a pleocytosis. Lupus anticoagulant was mildly abnormal.   Additionally I checked ANA, ANCA and they were normal.    Repeat MRI June 2017 showed several foci not present on earlier MRIs including some that enhanced in the posterior fossa.    REVIEW OF SYSTEMS: Constitutional: No fevers, chills, sweats, or change in appetite Eyes: No visual changes, double vision, eye pain Ear, nose and throat: No hearing loss, ear pain, nasal congestion, sore throat Cardiovascular: No chest pain, palpitations Respiratory: No shortness of breath at rest or with exertion.   No wheezes GastrointestinaI: No nausea, vomiting, diarrhea, abdominal pain, fecal incontinence Genitourinary: No dysuria, urinary retention or frequency.  No nocturia. Musculoskeletal: No neck pain, back pain Integumentary: No rash, pruritus, skin lesions Neurological: as above uPsychiatric:  No depression at this time.  No anxiety Endocrine: No palpitations, diaphoresis, change in appetite, change in weigh or increased thirst Hematologic/Lymphatic: No anemia, purpura, petechiae. Allergic/Immunologic: No itchy/runny eyes, nasal congestion, recent allergic reactions, rashes  ALLERGIES: No Known Allergies  HOME MEDICATIONS:  Current Outpatient Medications:  .  valACYclovir (VALTREX) 500 MG tablet, Take 500 mg by mouth daily., Disp: , Rfl:  .  azaTHIOprine (IMURAN) 50 MG tablet, One po qAM and two po qHS, Disp: 270 tablet, Rfl: 3 .  buPROPion (WELLBUTRIN XL) 300 MG 24 hr tablet, Take 1 tablet (300 mg total) by mouth daily., Disp: 90 tablet, Rfl: 3 .  diphenhydrAMINE (BENADRYL) 25 MG tablet, Take 1 tablet (25 mg total) by mouth every 6 (six) hours as needed for itching. (Patient not taking: Reported on 09/09/2018), Disp: 20 tablet, Rfl: 0 .  oxybutynin (DITROPAN) 5 MG tablet, Take 1 tablet (5 mg total) by mouth 2 (two) times daily. (Patient not taking: Reported on 09/09/2018), Disp: 180 tablet, Rfl: 3 .  permethrin (ELIMITE) 5 % cream, Apply to entire body, wash off after 8-14 hours. Repeat in 1 week if still symptomatic. (Patient not taking: Reported on 09/09/2018), Disp: 60 g, Rfl: 0 .  predniSONE (DELTASONE) 10 MG tablet, Take 2 tablets (20 mg total) by mouth daily with breakfast., Disp: 90 tablet, Rfl: 11 No current facility-administered medications for this visit.   Facility-Administered Medications Ordered in Other Visits:  .  gadopentetate dimeglumine (MAGNEVIST) injection 10 mL, 10 mL, Intravenous, Once PRN, Darleth Eustache A, MD .  gadopentetate dimeglumine (MAGNEVIST) injection 10 mL, 10 mL, Intravenous, Once PRN, [place] Didonato, Pearletha Furlichard A, MD  PAST MEDICAL HISTORY: Past Medical History:  Diagnosis Date  . Anxiety   . Daily headache   . Depression   . Genital HSV   . Hypertension 2003   "just right after I had my 1st baby"  . Panic attacks   . Stomach ulcer     PAST  SURGICAL HISTORY: Past Surgical History:  Procedure Laterality Date  . APPENDECTOMY  1992  . DILATION AND CURETTAGE OF UTERUS     S/P "miscarriage"    FAMILY HISTORY: Family History  Problem Relation Age of Onset  . Heart attack Father     SOCIAL HISTORY:  Social History   Socioeconomic History  .  Marital status: Legally Separated    Spouse name: Not on file  . Number of children: Not on file  . Years of education: Not on file  . Highest education level: Not on file  Occupational History  . Not on file  Social Needs  . Financial resource strain: Not on file  . Food insecurity:    Worry: Not on file    Inability: Not on file  . Transportation needs:    Medical: Not on file    Non-medical: Not on file  Tobacco Use  . Smoking status: Current Every Day Smoker    Packs/day: 1.00    Years: 19.00    Pack years: 19.00    Types: Cigarettes  . Smokeless tobacco: Never Used  Substance and Sexual Activity  . Alcohol use: Yes    Alcohol/week: 14.0 standard drinks    Types: 14 Glasses of wine per week    Comment: drinks 40oz beer daily.  . Drug use: Yes    Types: Marijuana  . Sexual activity: Yes    Birth control/protection: Condom, Spermicide  Lifestyle  . Physical activity:    Days per week: Not on file    Minutes per session: Not on file  . Stress: Not on file  Relationships  . Social connections:    Talks on phone: Not on file    Gets together: Not on file    Attends religious service: Not on file    Active member of club or organization: Not on file    Attends meetings of clubs or organizations: Not on file    Relationship status: Not on file  . Intimate partner violence:    Fear of current or ex partner: Not on file    Emotionally abused: Not on file    Physically abused: Not on file    Forced sexual activity: Not on file  Other Topics Concern  . Not on file  Social History Narrative  . Not on file     PHYSICAL EXAM  Vitals:   09/09/18 1150  BP:  (!) 136/95  Pulse: 84  Resp: 18  Weight: 184 lb (83.5 kg)  Height: 5\' 4"  (1.626 m)    Body mass index is 31.58 kg/m.   General: The patient is a thin woman in no acute distress  Neck:   Range of motion is normal.  She is nontender.  Neurologic Exam  Mental status: The patient is alert and oriented x 3 at the time of the examination. The patient has apparent normal recent and remote memory, with an apparently normal attention span and concentration ability.   Speech is normal.  Cranial nerves: Extraocular movements are full.  Facial strength and sensation are normal  Trapezius and sternocleidomastoid strength is normal. No dysarthria is noted.  The tongue is midline, and the patient has symmetric elevation of the soft palate. No obvious hearing deficits are noted.  Motor:  Muscle bulk is normal.   Tone is normal. Strength is  5 / 5 in all 4 extremities.   Sensory: Intact sensation to touch and vibration in the arms and legs.  Coordination: She has mildly reduced educed left finger-nose-finger testing.  There is reduced left heel-to-shin.  Gait and station: Station is normal.  The gait is mildly ataxic and slightly wide.  The tandem gait is poor.. Romberg is borderline.   Reflexes: Deep tendon reflexes are increased in legs with spread at her knees and nonsustained clonus at her ankles  DIAGNOSTIC DATA (LABS, IMAGING, TESTING) - I reviewed patient records, labs, notes, testing and imaging myself where available.  Lab Results  Component Value Date   WBC 10.6 04/18/2016   HGB 12.0 04/18/2016   HCT 35.2 04/18/2016   MCV 96 04/18/2016   PLT 345 04/18/2016      Component Value Date/Time   NA 141 04/18/2016 1352   K 3.8 04/18/2016 1352   CL 100 04/18/2016 1352   CO2 27 04/18/2016 1352   GLUCOSE 83 04/18/2016 1352   GLUCOSE 189 (H) 12/22/2015 0932   BUN 9 04/18/2016 1352   CREATININE 0.66 04/18/2016 1352   CALCIUM 9.1 04/18/2016 1352   PROT 6.7 04/18/2016 1352    ALBUMIN 4.5 04/18/2016 1352   AST 22 04/18/2016 1352   ALT 31 04/18/2016 1352   ALKPHOS 73 04/18/2016 1352   BILITOT 0.3 04/18/2016 1352   GFRNONAA 112 04/18/2016 1352   GFRAA 130 04/18/2016 1352       ASSESSMENT AND PLAN  Demyelinating disease of central nervous system (HCC) - Plan: Comprehensive metabolic panel  High risk medication use - Plan: CBC with Differential/Platelet, Comprehensive metabolic panel  Vitamin D deficiency - Plan: VITAMIN D 25 Hydroxy (Vit-D Deficiency, Fractures)  Ataxic gait  Disturbed cognition   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  MS is unlikely  1.  She self discontinued her medications a month ago.  We discussed getting back on Prednisone (I'll reduce from 20 mg po daily to 10 mg as she had weight gain) and  Azathioprine 50 mg po qAM and 100 mg po qHS for her neuroimmunologic disorder.    We will recheck MRI later in the year (last one unchanged) 2.   Advised to exercise and stay active 3.   She asked about medical marijuana but we discussed it is not legal. 4.   She will return to see me in 5-6 months or sooner if there are new or worsening neurologic symptoms.  March Steyer A. Epimenio FootSater, MD, PhD 09/09/2018, 5:27 PM Certified in Neurology, Clinical Neurophysiology, Sleep Medicine, Pain Medicine and Neuroimaging  [facility] [address], [address] [place], [place] [id number] [phone]  .

## 2018-09-10 LAB — CBC WITH DIFFERENTIAL/PLATELET
Basophils Absolute: 0.1 10*3/uL (ref 0.0–0.2)
Basos: 1 %
EOS (ABSOLUTE): 0.1 10*3/uL (ref 0.0–0.4)
Eos: 1 %
HEMOGLOBIN: 12.8 g/dL (ref 11.1–15.9)
Hematocrit: 37.3 % (ref 34.0–46.6)
Immature Grans (Abs): 0 10*3/uL (ref 0.0–0.1)
Immature Granulocytes: 0 %
Lymphocytes Absolute: 3.6 10*3/uL — ABNORMAL HIGH (ref 0.7–3.1)
Lymphs: 39 %
MCH: 31.2 pg (ref 26.6–33.0)
MCHC: 34.3 g/dL (ref 31.5–35.7)
MCV: 91 fL (ref 79–97)
Monocytes Absolute: 1 10*3/uL — ABNORMAL HIGH (ref 0.1–0.9)
Monocytes: 10 %
Neutrophils Absolute: 4.4 10*3/uL (ref 1.4–7.0)
Neutrophils: 49 %
Platelets: 342 10*3/uL (ref 150–450)
RBC: 4.1 x10E6/uL (ref 3.77–5.28)
RDW: 11.9 % (ref 11.7–15.4)
WBC: 9.1 10*3/uL (ref 3.4–10.8)

## 2018-09-10 LAB — COMPREHENSIVE METABOLIC PANEL
ALT: 32 IU/L (ref 0–32)
AST: 20 IU/L (ref 0–40)
Albumin/Globulin Ratio: 1.2 (ref 1.2–2.2)
Albumin: 4.2 g/dL (ref 3.8–4.8)
Alkaline Phosphatase: 133 IU/L — ABNORMAL HIGH (ref 39–117)
BUN/Creatinine Ratio: 13 (ref 9–23)
BUN: 11 mg/dL (ref 6–24)
Bilirubin Total: <0.2 mg/dL (ref 0.0–1.2)
CO2: 22 mmol/L (ref 20–29)
CREATININE: 0.84 mg/dL (ref 0.57–1.00)
Calcium: 9.5 mg/dL (ref 8.7–10.2)
Chloride: 102 mmol/L (ref 96–106)
GFR calc Af Amer: 101 mL/min/{1.73_m2} (ref >59)
GFR, EST NON AFRICAN AMERICAN: 87 mL/min/{1.73_m2} (ref >59)
Globulin, Total: 3.6 g/dL (ref 1.5–4.5)
Glucose: 95 mg/dL (ref 65–99)
Potassium: 4 mmol/L (ref 3.5–5.2)
Sodium: 141 mmol/L (ref 134–144)
Total Protein: 7.8 g/dL (ref 6.0–8.5)

## 2018-09-10 LAB — VITAMIN D 25 HYDROXY (VIT D DEFICIENCY, FRACTURES): Vit D, 25-Hydroxy: 6.5 ng/mL — ABNORMAL LOW (ref 30.0–100.0)

## 2018-09-11 ENCOUNTER — Telehealth: Payer: Self-pay | Admitting: *Deleted

## 2018-09-11 NOTE — Telephone Encounter (Signed)
Tried calling pt, mailbox full and unable to LVM

## 2018-09-11 NOTE — Telephone Encounter (Signed)
-----   Message from Asa Lente, MD sent at 09/10/2018  6:24 PM EST ----- Vitamin D is very low.  She needs to go back on the 50,000 units weekly and stay on it long-term.  #13   #3 refills

## 2018-09-15 MED ORDER — VITAMIN D (ERGOCALCIFEROL) 1.25 MG (50000 UNIT) PO CAPS: 50000.0000 [IU] | ORAL_CAPSULE | ORAL | 3 refills | Status: AC

## 2018-09-15 NOTE — Telephone Encounter (Signed)
Called and spoke with sister (on [place]) about lab results. She is her POA. She would like rx to go to [address]. I e-scribed rx for her.

## 2018-10-29 ENCOUNTER — Ambulatory Visit: Payer: Medicaid Other | Admitting: Neurology

## 2019-02-03 ENCOUNTER — Telehealth: Payer: Self-pay | Admitting: Neurology

## 2019-02-03 ENCOUNTER — Encounter: Payer: Self-pay | Admitting: Neurology

## 2019-02-03 NOTE — Telephone Encounter (Signed)
Pt is calling in stating she will need Dr Felecia Shelling to contact [facility] in [place], [place] on [address] , to discuss medical history and to state if she has had any issues that needs to be medicated and exacerbated by drug use. Pt is wanting to admit herself into this institution   Office Number - [phone]  Office Fax - [phone]

## 2019-02-03 NOTE — Telephone Encounter (Signed)
I wrote a brief letter.  Please send that along with my office visit note from January this year and the report from her last brain MRI.

## 2019-02-03 NOTE — Telephone Encounter (Signed)
Dr. Felecia Shelling- is this something you are able to do?

## 2019-02-04 NOTE — Telephone Encounter (Signed)
Michele Yu- can you contact pt? She will need to sign record release form for records. Thank you!

## 2019-02-04 NOTE — Telephone Encounter (Signed)
Done

## 2019-02-13 ENCOUNTER — Emergency Department (HOSPITAL_COMMUNITY)
Admission: EM | Admit: 2019-02-13 | Discharge: 2019-02-14 | Disposition: A | Discharge status: Home / Self Care | Payer: Medicaid Other | Attending: Emergency Medicine | Admitting: Emergency Medicine

## 2019-02-13 ENCOUNTER — Other Ambulatory Visit: Payer: Self-pay

## 2019-02-13 DIAGNOSIS — F1020 Alcohol dependence, uncomplicated: Secondary | ICD-10-CM | POA: Insufficient documentation

## 2019-02-13 DIAGNOSIS — F17210 Nicotine dependence, cigarettes, uncomplicated: Secondary | ICD-10-CM | POA: Insufficient documentation

## 2019-02-13 DIAGNOSIS — Z7952 Long term (current) use of systemic steroids: Secondary | ICD-10-CM | POA: Insufficient documentation

## 2019-02-13 DIAGNOSIS — Z79899 Other long term (current) drug therapy: Secondary | ICD-10-CM | POA: Diagnosis not present

## 2019-02-13 DIAGNOSIS — F918 Other conduct disorders: Secondary | ICD-10-CM | POA: Diagnosis not present

## 2019-02-13 DIAGNOSIS — R454 Irritability and anger: Secondary | ICD-10-CM | POA: Diagnosis present

## 2019-02-13 DIAGNOSIS — Z1159 Encounter for screening for other viral diseases: Secondary | ICD-10-CM | POA: Insufficient documentation

## 2019-02-13 DIAGNOSIS — F29 Unspecified psychosis not due to a substance or known physiological condition: Secondary | ICD-10-CM | POA: Diagnosis not present

## 2019-02-13 DIAGNOSIS — F1220 Cannabis dependence, uncomplicated: Secondary | ICD-10-CM | POA: Diagnosis present

## 2019-02-13 DIAGNOSIS — F1290 Cannabis use, unspecified, uncomplicated: Secondary | ICD-10-CM | POA: Diagnosis not present

## 2019-02-13 DIAGNOSIS — F12988 Cannabis use, unspecified with other cannabis-induced disorder: Secondary | ICD-10-CM | POA: Diagnosis not present

## 2019-02-13 DIAGNOSIS — Z008 Encounter for other general examination: Secondary | ICD-10-CM

## 2019-02-13 DIAGNOSIS — R443 Hallucinations, unspecified: Secondary | ICD-10-CM | POA: Diagnosis present

## 2019-02-13 DIAGNOSIS — R4689 Other symptoms and signs involving appearance and behavior: Secondary | ICD-10-CM | POA: Diagnosis present

## 2019-02-13 DIAGNOSIS — I10 Essential (primary) hypertension: Secondary | ICD-10-CM | POA: Diagnosis not present

## 2019-02-13 LAB — CBC
HCT: 29.7 % — ABNORMAL LOW (ref 36.0–46.0)
Hemoglobin: 9.3 g/dL — ABNORMAL LOW (ref 12.0–15.0)
MCH: 25.5 pg — ABNORMAL LOW (ref 26.0–34.0)
MCHC: 31.3 g/dL (ref 30.0–36.0)
MCV: 81.4 fL (ref 80.0–100.0)
Platelets: 348 10*3/uL (ref 150–400)
RBC: 3.65 MIL/uL — ABNORMAL LOW (ref 3.87–5.11)
RDW: 15.6 % — ABNORMAL HIGH (ref 11.5–15.5)
WBC: 7.5 10*3/uL (ref 4.0–10.5)
nRBC: 0 % (ref 0.0–0.2)

## 2019-02-13 LAB — COMPREHENSIVE METABOLIC PANEL
ALT: 73 U/L — ABNORMAL HIGH (ref 0–44)
AST: 45 U/L — ABNORMAL HIGH (ref 15–41)
Albumin: 3.5 g/dL (ref 3.5–5.0)
Alkaline Phosphatase: 117 U/L (ref 38–126)
Anion gap: 10 (ref 5–15)
BUN: 15 mg/dL (ref 6–20)
CO2: 23 mmol/L (ref 22–32)
Calcium: 9 mg/dL (ref 8.9–10.3)
Chloride: 104 mmol/L (ref 98–111)
Creatinine, Ser: 0.68 mg/dL (ref 0.44–1.00)
GFR calc Af Amer: >60 mL/min (ref >60)
GFR calc non Af Amer: >60 mL/min (ref >60)
Glucose, Bld: 154 mg/dL — ABNORMAL HIGH (ref 70–99)
Potassium: 3.9 mmol/L (ref 3.5–5.1)
Sodium: 137 mmol/L (ref 135–145)
Total Bilirubin: 0.6 mg/dL (ref 0.3–1.2)
Total Protein: 7.1 g/dL (ref 6.5–8.1)

## 2019-02-13 LAB — ETHANOL: Alcohol, Ethyl (B): <10 mg/dL (ref <10)

## 2019-02-13 LAB — I-STAT BETA HCG BLOOD, ED (MC, WL, AP ONLY): I-stat hCG, quantitative: <5 m[IU]/mL (ref <5)

## 2019-02-13 LAB — ACETAMINOPHEN LEVEL: Acetaminophen (Tylenol), Serum: <10 ug/mL — ABNORMAL LOW (ref 10–30)

## 2019-02-13 LAB — RAPID URINE DRUG SCREEN, HOSP PERFORMED
Amphetamines: NOT DETECTED
Barbiturates: NOT DETECTED
Benzodiazepines: NOT DETECTED
Cocaine: NOT DETECTED
Opiates: NOT DETECTED
Tetrahydrocannabinol: POSITIVE — AB

## 2019-02-13 LAB — SARS CORONAVIRUS 2 BY RT PCR (HOSPITAL ORDER, PERFORMED IN ~~LOC~~ HOSPITAL LAB): SARS Coronavirus 2: NEGATIVE

## 2019-02-13 LAB — SALICYLATE LEVEL: Salicylate Lvl: <7 mg/dL (ref 2.8–30.0)

## 2019-02-13 NOTE — Progress Notes (Signed)
[facility] CPS report completed.   Audree Camel, LCSW, [place] Disposition [place] [facility]/TTS [phone]
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
## 2019-02-13 NOTE — [facility] Counselor (Signed)
At 1924, clinician spoke to [facility], RN to fax pt's IVC paperwork and set up TTS cart.    Vertell Novak, [place], [facility], [facility] Triage Specialist [phone]

## 2019-02-13 NOTE — ED Notes (Signed)
Made pt aware that we need a urine sample and pt stated she is unable to go at this time.

## 2019-02-13 NOTE — ED Notes (Signed)
I spoke with the counselor from [facility]. Per the phone call,This patient will need to be reassessed in the morning by the Psychiatrist. I have asked if someone will be able to complete her first exam form. Prior nurse was unable to get form completed by provider.
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
## 2019-02-14 DIAGNOSIS — R4689 Other symptoms and signs involving appearance and behavior: Secondary | ICD-10-CM | POA: Diagnosis present

## 2019-02-14 DIAGNOSIS — F918 Other conduct disorders: Secondary | ICD-10-CM

## 2019-02-14 DIAGNOSIS — F12988 Cannabis use, unspecified with other cannabis-induced disorder: Secondary | ICD-10-CM

## 2019-02-14 DIAGNOSIS — F1220 Cannabis dependence, uncomplicated: Secondary | ICD-10-CM | POA: Diagnosis present

## 2019-02-14 NOTE — ED Notes (Signed)
Pt spoke w/"Tony" on phone from nurses' desk. Stated she was upset d/t "you put me here". States she has a neurologist she sees and states "I am chemically imbalanced. 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 States she stopped taking her meds and she is homeless.

## 2019-02-14 NOTE — ED Notes (Signed)
IVC papers rescinded by Dr Regenia Skeeter - ALL belongings - 1 labeled belongings bag - returned to pt - Pt signed verifying all items present. Pt initially stated she did not want resources given by SW/CM. Pt then took resources after much discussion and encouragement.
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
## 2019-02-14 NOTE — Progress Notes (Signed)
CSW received consult for homelessness resources. CSW met with patient and discussed homelessness resources and substance use resources. CSW attempted to reach out to [facility] house and noted both numbers were not in service.  CSW is signing off at this time as patient reported she would follow-up on additional resources.  Lamonte Richer, LCSW, [place] Worker II [phone]

## 2019-02-14 NOTE — ED Notes (Signed)
Pt states her family thinks she is crazy - states "but I'm not crazy".

## 2019-02-14 NOTE — Progress Notes (Signed)
Per Marvia Pickles NP, pt is psychiatrically cleared. Darnelle Maffucci relayed disposition to NP and Archivist. Social work was asked to provide community resources for pt before she discharges.  Finnigan Warriner S. Ouida Sills, MSW, LCSW Clinical Social Worker 02/14/2019 9:03 AM

## 2019-02-14 NOTE — ED Notes (Signed)
Pt in shower.  

## 2019-02-14 NOTE — ED Provider Notes (Signed)
Patient has been cleared by Psych. IVC rescinded. No acute complaints besides wanting discharge.    Sherwood Gambler, MD 02/14/19 1050

## 2019-02-14 NOTE — ED Notes (Signed)
Breakfast tray ordered 

## 2019-02-14 NOTE — ED Notes (Signed)
Pt requesting to smoke.  I informed pt that we could look into a nicotine patch.

## 2019-03-10 ENCOUNTER — Other Ambulatory Visit: Payer: Self-pay

## 2019-03-10 ENCOUNTER — Encounter: Payer: Self-pay | Admitting: Neurology

## 2019-03-10 ENCOUNTER — Ambulatory Visit: Payer: Medicaid Other | Admitting: Neurology

## 2019-03-10 ENCOUNTER — Telehealth: Payer: Self-pay | Admitting: Neurology

## 2019-03-10 VITALS — BP 123/81 | HR 88 | Temp 97.9°F | Ht 64.0 in | Wt 169.0 lb

## 2019-03-10 DIAGNOSIS — G3281 Cerebellar ataxia in diseases classified elsewhere: Secondary | ICD-10-CM

## 2019-03-10 DIAGNOSIS — R260 Ataxic gait: Secondary | ICD-10-CM | POA: Diagnosis not present

## 2019-03-10 DIAGNOSIS — R4689 Other symptoms and signs involving appearance and behavior: Secondary | ICD-10-CM

## 2019-03-10 DIAGNOSIS — Z79899 Other long term (current) drug therapy: Secondary | ICD-10-CM

## 2019-03-10 DIAGNOSIS — R9089 Other abnormal findings on diagnostic imaging of central nervous system: Secondary | ICD-10-CM | POA: Diagnosis not present

## 2019-03-10 MED ORDER — TRAZODONE HCL 50 MG PO TABS: ORAL_TABLET | ORAL | 5 refills | Status: AC

## 2019-03-10 MED ORDER — AZATHIOPRINE 50 MG PO TABS: ORAL_TABLET | ORAL | 3 refills | Status: AC

## 2019-03-10 NOTE — Telephone Encounter (Signed)
Medicaid order sent to [facility]. They will obtain the auth and they will reach out to the pt to schedule.  °

## 2019-03-10 NOTE — Progress Notes (Signed)
[facility]  PATIENT: Michele Yu DOB: 24-Mar-1976  REFERRING DOCTOR OR PCP:  Ellison Carwin, MD SOURCE: patient, EMR records, MRI images on PACS  _________________________________   HISTORICAL  CHIEF COMPLAINT:  Chief Complaint  Patient presents with   Follow-up    RM 12 with Horton Finer, cousin. His temp: 98.2. Last seen 09/09/18. NOT taking prednisone 10mg  qd, azathioprine 50mg  qam and 100mg  qhs. States she is not working right now and does not have funds to pay for medication.    HISTORY OF PRESENT ILLNESS:  Michele Yu is a 43 y.o.woman with abnormal MRI's and neurologic symptoms.  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   Brain lesions have been responsive to steroids and other autoimmune medications.  At the last visit she was restarted on prednisone and Imuran that she had discontinued due to weight gain.  I lowered the prednisone dose in the hope that she would not have weight gain.  However, she reports that she is not taking her prednisone and Imuran at this time.   She took only during January and February maybe March and has been off since.     Her gait is off balanced.   She denies any falls.  She denies numbness or weakness.   Writing has been poor the last few years.   She denies any tremors.    She denies bladder frequency.    She feels her fatigue is better this year.   She is sleeping well.   She is going through a separation and has more stress and depression.   Cognition is stable this year.    She has multiple bed bug bites.      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    She felt she had stabilized on those meds but stopped both of them last month and now is feeling worse again but has no definite new neurologic symptom.    She has had a lot of stress and feels that may have played some  role.  She feels a lot of apathy and was spending a lot of time laying down during the day.  She is both tired and sleepy  She is separated from her husband bu they are talking again.   She has been unable to work, partially due to her fatigue.   This has likely contributed to worse depression and worse self esteem.   She stopped azathioprine and prednisone last month and is only on Valtrex for herpes.    She had lost weight a couple ytears ago but now has gained her weight back.    She had some urinary urgency and some incontinence but now is better and stopped the oxybutynin.     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  In 2017 she had multiple enhancing lesions, mostly in the infratentorial region.  She responded to steroids and Imuran.  She had been doing well about a year ago and an MRI showed resolution of many of the lesions and no enhancing lesions.  She does not have any lesions in her spinal cord.  Unfortunately, over the last year she has not taken her medicines much.  She stopped all of her medications due to the cost about 6 months ago but was taking it intermittently for least a few months before that..   She feels her walking is doing worse.   She is  not needing to use a cane.  She needs to hold onto the wall or furniture at times.   Originally, she was experiencing a lot of headaches but she does not note many currently.       She has urinary urgency with incontinence daily.    She never filled the oxybutynin.   She wears Depends.  She has some cognitive issues.  Specifically she reports reduced short-term memory and poor focus and attention.  Update 07/02/2017:   She notes no new symptoms and continues to have difficulty with gait/balance and memory.    Headaches have resolved.    Gait is off sur to both weakness and balance.   She has no recent fall.   She does not note any arm numbness or weakness.  Cognitive issues include poor STM and poor focus and  attention.   She needs to wear Depends due to bladder incontinence.  She denies any difficulty with her vision.       She has not been taking Imuran or prednisone x 4 months and not regularly x 6-7 months due to expense.   We discussed some strategies to reduce cost including using [employer]    She had lost a lot of weight but now is back at her original weight of around 160 pounds.      From 02/26/2017: who has had neurologic symptoms associated with abnormal MRI with many enhancing foci starting November 2016. Cognition, balance and other neurologic issues were worse in early 2017 but she used to have difficulties with these neurologic issues.    Due to physical and cognitive issues, she is unable to work.    Her last MRI was 11/02/16 and compared to 01/26/2016 showed no new lesions and continued abnormal foci in the hemispheres and brainstem,  She was placed on Imuran and prednisone. She tolerated it but stopped both medicines last month due to expense. We discussed the importance of staying on therapy.  Cognitive:   She has trouble with focus and attention.   She also has continued difficulty with short-term memory and executive function. She has trouble following complicated instructions..   Mood is better than last year but she is still irritable  Gait/balance/strength/sensation:   Gait is poor. She stumbles frequently.  She can walk without a cane but is safer when she uses one.   She denies any numbness.   Her arms feel fine.      Vision:    She has no more blurred or double vision.  Bladder:   Bladder function is still a problem and she has incontinence, worse at night.   She cannot afford the oxybutynin. She has no hesitancy.    Bowel is fine.  Headache is much better since starting prednisone.  MRI showed multiple foci, with many in the pons and middle cerebellar peduncles. Some foci enhanced. Many of these foci were not present on her earlier MRIs. An MRI she had about a month after her  initial MRI (that showed enhancing lesions) no significant improvement and this third MRI is definitely worse. The etiology is uncertain. The pattern is not really typical for MS.   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  This could certainly explain her rapid improvement both time she has received IV steroids.   History of diplopia/numbness:    She presented with diplopia and right facial numbness in late November 2016.  She was admitted  and received 5 days  of IV steroids. While she was in the hospital, she did have urinary incontinence.  She had one or 2 more episodes after she was discharged. She did not note any major cognitive dysfunction while she was in the hospital. However, she thinks that she was a little bit slowed in her thoughts.  While there, she had an MRI of the brain that showed a white matter lesion in the right side of the mid brain to the adjacent thalamus and also some patchy white matter changes in the hemispheres. There was patchy enhancement of some of these foci.   MRI of the cervical spinal cord did not show any demyelinating lesions. She did have mild degenerative changes that were unlikely to be playing a role. I reviewed these MRIs and concur with the official interpretation. A lumbar puncture was performed. The cerebrospinal fluid did not show elevated IgG nor oligoclonal bands. However, she did have increased number of white blood cells (175), with a pleocytosis with lymphocytes > neutrophils.     By the time she was discharged, the facial numbness had resolved and the diplopia was almost completely resolved.   MRI and lab History:  MRI late November 2016 was abnormal and showed a focus in the right pons and a few other patchy enhancing foci elsewhere. She improved significantly with steroids. A repeat MRI in January 2017 showed that the pontine lesion resolved though the other foci were still  present but not enhancing. Lumbar puncture did not show oligoclonal bands though there was a pleocytosis. Lupus anticoagulant was mildly abnormal.   Additionally I checked ANA, ANCA and they were normal.    Repeat MRI June 2017 showed several foci not present on earlier MRIs including some that enhanced in the posterior fossa.    REVIEW OF SYSTEMS: Constitutional: No fevers, chills, sweats, or change in appetite Eyes: No visual changes, double vision, eye pain Ear, nose and throat: No hearing loss, ear pain, nasal congestion, sore throat Cardiovascular: No chest pain, palpitations Respiratory: No shortness of breath at rest or with exertion.   No wheezes GastrointestinaI: No nausea, vomiting, diarrhea, abdominal pain, fecal incontinence Genitourinary: No dysuria, urinary retention or frequency.  No nocturia. Musculoskeletal: No neck pain, back pain Integumentary: No rash, pruritus, skin lesions Neurological: as above uPsychiatric: No depression at this time.  No anxiety Endocrine: No palpitations, diaphoresis, change in appetite, change in weigh or increased thirst Hematologic/Lymphatic: No anemia, purpura, petechiae. Allergic/Immunologic: No itchy/runny eyes, nasal congestion, recent allergic reactions, rashes  ALLERGIES: No Known Allergies  HOME MEDICATIONS:  Current Outpatient Medications:    azaTHIOprine (IMURAN) 50 MG tablet, One po qAM and two po qHS, Disp: 270 tablet, Rfl: 3   buPROPion (WELLBUTRIN XL) 300 MG 24 hr tablet, Take 1 tablet (300 mg total) by mouth daily. (Patient not taking: Reported on 02/14/2019), Disp: 90 tablet, Rfl: 3   oxybutynin (DITROPAN) 5 MG tablet, Take 1 tablet (5 mg total) by mouth 2 (two) times daily. (Patient not taking: Reported on 02/14/2019), Disp: 180 tablet, Rfl: 3   predniSONE (DELTASONE) 10 MG tablet, Take 2 tablets (20 mg total) by mouth daily with breakfast. (Patient not taking: Reported on 02/14/2019), Disp: 90 tablet, Rfl: 11    traZODone (DESYREL) 50 MG tablet, One or two at bedtime, Disp: 60 tablet, Rfl: 5   Vitamin D, Ergocalciferol, (DRISDOL) 1.25 MG (50000 UT) CAPS capsule, Take 1 capsule (50,000 Units total) by mouth every 7 (seven) days. (Patient not taking: Reported on 02/14/2019), Disp: 13 capsule,  Rfl: 3 No current facility-administered medications for this visit.   Facility-Administered Medications Ordered in Other Visits:    gadopentetate dimeglumine (MAGNEVIST) injection 10 mL, 10 mL, Intravenous, Once PRN, Kieanna Rollo A, MD   gadopentetate dimeglumine (MAGNEVIST) injection 10 mL, 10 mL, Intravenous, Once PRN, Shalanda Brogden, Pearletha Furlichard A, MD  PAST MEDICAL HISTORY: Past Medical History:  Diagnosis Date   Anxiety    Daily headache    Depression    Genital HSV    Hypertension 2003   "just right after I had my 1st baby"   Panic attacks    Stomach ulcer     PAST SURGICAL HISTORY: Past Surgical History:  Procedure Laterality Date   APPENDECTOMY  1992   DILATION AND CURETTAGE OF UTERUS     S/P "miscarriage"    FAMILY HISTORY: Family History  Problem Relation Age of Onset   Heart attack Father     SOCIAL HISTORY:  Social History   Socioeconomic History   Marital status: Legally Separated    Spouse name: Not on file   Number of children: Not on file   Years of education: Not on file   Highest education level: Not on file  Occupational History   Not on file  Social Needs   Financial resource strain: Not on file   Food insecurity    Worry: Not on file    Inability: Not on file   Transportation needs    Medical: Not on file    Non-medical: Not on file  Tobacco Use   Smoking status: Current Every Day Smoker    Packs/day: 1.00    Years: 19.00    Pack years: 19.00    Types: Cigarettes   Smokeless tobacco: Never Used  Substance and Sexual Activity   Alcohol use: Yes    Alcohol/week: 14.0 standard drinks    Types: 14 Glasses of wine per week    Comment: drinks 40oz  beer daily.   Drug use: Yes    Types: Marijuana   Sexual activity: Yes    Birth control/protection: Condom, Spermicide  Lifestyle   Physical activity    Days per week: Not on file    Minutes per session: Not on file   Stress: Not on file  Relationships   Social connections    Talks on phone: Not on file    Gets together: Not on file    Attends religious service: Not on file    Active member of club or organization: Not on file    Attends meetings of clubs or organizations: Not on file    Relationship status: Not on file   Intimate partner violence    Fear of current or ex partner: Not on file    Emotionally abused: Not on file    Physically abused: Not on file    Forced sexual activity: Not on file  Other Topics Concern   Not on file  Social History Narrative   Not on file     PHYSICAL EXAM  Vitals:   03/10/19 1259  BP: 123/81  Pulse: 88  Temp: 97.9 F (36.6 C)  Weight: 169 lb (76.7 kg)  Height: 5\' 4"  (1.626 m)    Body mass index is 29.01 kg/m.   General: The patient is a thin woman in no acute distress  Neck:   Range of motion is normal.  She is nontender.  Neurologic Exam  Mental status: The patient is alert and oriented x 3 at the time of the examination.  The patient has apparent normal recent and remote memory, with an apparently normal attention span and concentration ability.   Speech is normal.  Cranial nerves: Extraocular movements are full.  Facial strength and sensation is normal.  Trapezius strength is normal.  She does not have any dysarthria.  The tongue is midline, and the patient has symmetric elevation of the soft palate. No obvious hearing deficits are noted.  Motor:  Muscle bulk is normal.   Tone is normal. Strength is  5 / 5 in all 4 extremities.   Sensory: Intact sensation to touch and vibration in the arms and legs.  Coordination: Today, finger-nose-finger and heel-to-shin was performed well.  Gait and station: Station is  normal.  The gait is fairly normal though the tandem gait is wide.. Romberg is borderline.   Reflexes: Deep tendon reflexes are increased in legs with spread at her knees and nonsustained clonus at her ankles    DIAGNOSTIC DATA (LABS, IMAGING, TESTING) - I reviewed patient records, labs, notes, testing and imaging myself where available.  Lab Results  Component Value Date   WBC 7.5 02/13/2019   HGB 9.3 (L) 02/13/2019   HCT 29.7 (L) 02/13/2019   MCV 81.4 02/13/2019   PLT 348 02/13/2019      Component Value Date/Time   NA 137 02/13/2019 1445   NA 141 09/09/2018 1301   K 3.9 02/13/2019 1445   CL 104 02/13/2019 1445   CO2 23 02/13/2019 1445   GLUCOSE 154 (H) 02/13/2019 1445   BUN 15 02/13/2019 1445   BUN 11 09/09/2018 1301   CREATININE 0.68 02/13/2019 1445   CALCIUM 9.0 02/13/2019 1445   PROT 7.1 02/13/2019 1445   PROT 7.8 09/09/2018 1301   ALBUMIN 3.5 02/13/2019 1445   ALBUMIN 4.2 09/09/2018 1301   AST 45 (H) 02/13/2019 1445   ALT 73 (H) 02/13/2019 1445   ALKPHOS 117 02/13/2019 1445   BILITOT 0.6 02/13/2019 1445   BILITOT <0.2 09/09/2018 1301   GFRNONAA >60 02/13/2019 1445   GFRAA >60 02/13/2019 1445       ASSESSMENT AND PLAN    1. 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 Ataxic gait   4. Behavioral change   5. High risk medication use      She appears stable at this time but has had an inflammatory CNS disease of unknown etiology.  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  MS is unlikely  1.  We discussed getting back on medication for the immune system to try to prevent a third flareup of the CNS disorder.  She will restart azathioprine 50 mg po qAM and 100 mg po qHS for her neuroimmunologic disorder.  Due to weight gain, she does not want to go back on prednisone.  We will hold off at this time but reconsider if  she has an exacerbation.  We will recheck MRI to assess stability and determine if there have been any more inflammatory lesions.  If so, consider different medications.   2.   Advised to exercise and stay active 3.  She will return to see me in 5-6 months or sooner if there are new or worsening neurologic symptoms.  Hilton Saephan A. Epimenio Foot, MD, PhD 03/10/2019, 2:01 PM Certified in Neurology, Clinical Neurophysiology, Sleep Medicine, Pain Medicine and Neuroimaging  [facility] [address], [address] [place], [place] [address] [phone]  .

## 2019-04-16 ENCOUNTER — Inpatient Hospital Stay: Admission: RE | Admit: 2019-04-16 | Payer: Medicaid Other | Source: Ambulatory Visit

## 2019-09-02 ENCOUNTER — Telehealth: Payer: Self-pay | Admitting: Neurology

## 2019-09-02 NOTE — Telephone Encounter (Signed)
LVM to r/s 1/21 appt

## 2019-09-08 ENCOUNTER — Encounter: Payer: Self-pay | Admitting: Neurology

## 2019-09-08 NOTE — Telephone Encounter (Signed)
Called patient again and LVM to r/s 1/21 appointment. Appointment has been cancelled and letter has been sent to patient to advise.

## 2019-09-10 ENCOUNTER — Ambulatory Visit: Payer: Medicaid Other | Admitting: Neurology

## 2019-10-28 ENCOUNTER — Ambulatory Visit: Payer: Medicaid Other | Admitting: Neurology

## 2019-10-29 ENCOUNTER — Encounter: Payer: Self-pay | Admitting: Neurology

## 2020-12-12 DIAGNOSIS — Z0271 Encounter for disability determination: Secondary | ICD-10-CM

## 2021-02-23 ENCOUNTER — Emergency Department (HOSPITAL_COMMUNITY): Payer: Medicaid Other

## 2021-02-23 ENCOUNTER — Emergency Department (HOSPITAL_COMMUNITY)
Admission: EM | Admit: 2021-02-23 | Discharge: 2021-02-24 | Disposition: A | Discharge status: Home / Self Care | Payer: Medicaid Other | Attending: Emergency Medicine | Admitting: Emergency Medicine

## 2021-02-23 ENCOUNTER — Other Ambulatory Visit: Payer: Self-pay

## 2021-02-23 DIAGNOSIS — S40811A Abrasion of right upper arm, initial encounter: Secondary | ICD-10-CM | POA: Insufficient documentation

## 2021-02-23 DIAGNOSIS — Y92410 Unspecified street and highway as the place of occurrence of the external cause: Secondary | ICD-10-CM | POA: Insufficient documentation

## 2021-02-23 DIAGNOSIS — S0083XA Contusion of other part of head, initial encounter: Secondary | ICD-10-CM | POA: Insufficient documentation

## 2021-02-23 DIAGNOSIS — S80811A Abrasion, right lower leg, initial encounter: Secondary | ICD-10-CM | POA: Insufficient documentation

## 2021-02-23 DIAGNOSIS — M79675 Pain in left toe(s): Secondary | ICD-10-CM | POA: Insufficient documentation

## 2021-02-23 DIAGNOSIS — F17210 Nicotine dependence, cigarettes, uncomplicated: Secondary | ICD-10-CM | POA: Insufficient documentation

## 2021-02-23 DIAGNOSIS — F1910 Other psychoactive substance abuse, uncomplicated: Secondary | ICD-10-CM | POA: Insufficient documentation

## 2021-02-23 DIAGNOSIS — Z23 Encounter for immunization: Secondary | ICD-10-CM | POA: Insufficient documentation

## 2021-02-23 DIAGNOSIS — S0003XA Contusion of scalp, initial encounter: Secondary | ICD-10-CM

## 2021-02-23 DIAGNOSIS — I10 Essential (primary) hypertension: Secondary | ICD-10-CM | POA: Diagnosis not present

## 2021-02-23 DIAGNOSIS — S80812A Abrasion, left lower leg, initial encounter: Secondary | ICD-10-CM | POA: Diagnosis not present

## 2021-02-23 DIAGNOSIS — S40812A Abrasion of left upper arm, initial encounter: Secondary | ICD-10-CM | POA: Insufficient documentation

## 2021-02-23 DIAGNOSIS — T07XXXA Unspecified multiple injuries, initial encounter: Secondary | ICD-10-CM

## 2021-02-23 DIAGNOSIS — Y900 Blood alcohol level of less than 20 mg/100 ml: Secondary | ICD-10-CM | POA: Insufficient documentation

## 2021-02-23 DIAGNOSIS — S0990XA Unspecified injury of head, initial encounter: Secondary | ICD-10-CM | POA: Diagnosis present

## 2021-02-23 LAB — I-STAT CHEM 8, ED
BUN: 9 mg/dL (ref 6–20)
Calcium, Ion: 1.11 mmol/L — ABNORMAL LOW (ref 1.15–1.40)
Chloride: 106 mmol/L (ref 98–111)
Creatinine, Ser: 0.8 mg/dL (ref 0.44–1.00)
Glucose, Bld: 96 mg/dL (ref 70–99)
HCT: 41 % (ref 36.0–46.0)
Hemoglobin: 13.9 g/dL (ref 12.0–15.0)
Potassium: 3.5 mmol/L (ref 3.5–5.1)
Sodium: 140 mmol/L (ref 135–145)
TCO2: 23 mmol/L (ref 22–32)

## 2021-02-23 LAB — CBC
HCT: 39.1 % (ref 36.0–46.0)
Hemoglobin: 13.1 g/dL (ref 12.0–15.0)
MCH: 31.2 pg (ref 26.0–34.0)
MCHC: 33.5 g/dL (ref 30.0–36.0)
MCV: 93.1 fL (ref 80.0–100.0)
Platelets: 286 10*3/uL (ref 150–400)
RBC: 4.2 MIL/uL (ref 3.87–5.11)
RDW: 13.2 % (ref 11.5–15.5)
WBC: 7.5 10*3/uL (ref 4.0–10.5)
nRBC: 0 % (ref 0.0–0.2)

## 2021-02-23 LAB — I-STAT BETA HCG BLOOD, ED (MC, WL, AP ONLY): I-stat hCG, quantitative: <5 m[IU]/mL (ref <5)

## 2021-02-23 LAB — PROTIME-INR
INR: 1 (ref 0.8–1.2)
Prothrombin Time: 12.7 seconds (ref 11.4–15.2)

## 2021-02-23 LAB — SAMPLE TO BLOOD BANK

## 2021-02-23 MED ORDER — SODIUM CHLORIDE 0.9 % IV BOLUS
500.0000 mL | Freq: Once | INTRAVENOUS | Status: AC
Start: 2021-02-23 — End: 2021-02-24
  Administered 2021-02-23: 500 mL via INTRAVENOUS

## 2021-02-23 MED ORDER — TETANUS-DIPHTH-ACELL PERTUSSIS 5-2.5-18.5 LF-MCG/0.5 IM SUSY
0.5000 mL | PREFILLED_SYRINGE | Freq: Once | INTRAMUSCULAR | Status: AC
  Administered 2021-02-23: 0.5 mL via INTRAMUSCULAR
  Filled 2021-02-23: qty 0.5
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
## 2021-02-24 ENCOUNTER — Encounter (HOSPITAL_COMMUNITY): Payer: Self-pay | Admitting: Emergency Medicine

## 2021-02-24 ENCOUNTER — Emergency Department (HOSPITAL_COMMUNITY)
Admission: EM | Admit: 2021-02-24 | Discharge: 2021-02-24 | Disposition: A | Discharge status: Home / Self Care | Payer: Medicaid Other | Source: Home / Self Care | Attending: Emergency Medicine | Admitting: Emergency Medicine

## 2021-02-24 ENCOUNTER — Other Ambulatory Visit: Payer: Self-pay

## 2021-02-24 DIAGNOSIS — I10 Essential (primary) hypertension: Secondary | ICD-10-CM | POA: Insufficient documentation

## 2021-02-24 DIAGNOSIS — F1910 Other psychoactive substance abuse, uncomplicated: Secondary | ICD-10-CM | POA: Insufficient documentation

## 2021-02-24 DIAGNOSIS — F17210 Nicotine dependence, cigarettes, uncomplicated: Secondary | ICD-10-CM | POA: Insufficient documentation

## 2021-02-24 DIAGNOSIS — Y9289 Other specified places as the place of occurrence of the external cause: Secondary | ICD-10-CM | POA: Insufficient documentation

## 2021-02-24 DIAGNOSIS — T07XXXA Unspecified multiple injuries, initial encounter: Secondary | ICD-10-CM

## 2021-02-24 DIAGNOSIS — S0081XA Abrasion of other part of head, initial encounter: Secondary | ICD-10-CM | POA: Insufficient documentation

## 2021-02-24 DIAGNOSIS — S0031XA Abrasion of nose, initial encounter: Secondary | ICD-10-CM | POA: Insufficient documentation

## 2021-02-24 DIAGNOSIS — S80819A Abrasion, unspecified lower leg, initial encounter: Secondary | ICD-10-CM | POA: Insufficient documentation

## 2021-02-24 DIAGNOSIS — W228XXA Striking against or struck by other objects, initial encounter: Secondary | ICD-10-CM | POA: Insufficient documentation

## 2021-02-24 LAB — COMPREHENSIVE METABOLIC PANEL
ALT: 23 U/L (ref 0–44)
AST: 33 U/L (ref 15–41)
Albumin: 3.7 g/dL (ref 3.5–5.0)
Alkaline Phosphatase: 67 U/L (ref 38–126)
Anion gap: 9 (ref 5–15)
BUN: 8 mg/dL (ref 6–20)
CO2: 23 mmol/L (ref 22–32)
Calcium: 8.8 mg/dL — ABNORMAL LOW (ref 8.9–10.3)
Chloride: 105 mmol/L (ref 98–111)
Creatinine, Ser: 0.87 mg/dL (ref 0.44–1.00)
GFR, Estimated: >60 mL/min (ref >60)
Glucose, Bld: 101 mg/dL — ABNORMAL HIGH (ref 70–99)
Potassium: 3.6 mmol/L (ref 3.5–5.1)
Sodium: 137 mmol/L (ref 135–145)
Total Bilirubin: 0.4 mg/dL (ref 0.3–1.2)
Total Protein: 6.9 g/dL (ref 6.5–8.1)

## 2021-02-24 LAB — ETHANOL: Alcohol, Ethyl (B): <10 mg/dL (ref <10)

## 2021-02-24 LAB — LACTIC ACID, PLASMA: Lactic Acid, Venous: 1.8 mmol/L (ref 0.5–1.9)

## 2021-02-24 MED ORDER — NAPROXEN 500 MG PO TABS: 500.0000 mg | ORAL_TABLET | Freq: Two times a day (BID) | ORAL | 0 refills | Status: DC

## 2021-02-24 MED ORDER — METHOCARBAMOL 500 MG PO TABS: 500.0000 mg | ORAL_TABLET | Freq: Two times a day (BID) | ORAL | 0 refills | Status: DC | PRN

## 2021-02-24 MED ORDER — OXYCODONE-ACETAMINOPHEN 5-325 MG PO TABS
1.0000 | ORAL_TABLET | Freq: Once | ORAL | Status: AC
  Administered 2021-02-24: 1 via ORAL
  Filled 2021-02-24: qty 1

## 2021-02-24 MED ORDER — IOHEXOL 300 MG/ML  SOLN
100.0000 mL | Freq: Once | INTRAMUSCULAR | Status: AC | PRN
  Administered 2021-02-24: 100 mL via INTRAVENOUS

## 2021-02-24 MED ORDER — DIPHENHYDRAMINE HCL 25 MG PO CAPS
25.0000 mg | ORAL_CAPSULE | Freq: Once | ORAL | Status: AC
  Administered 2021-02-24: 25 mg via ORAL
  Filled 2021-02-24: qty 1

## 2021-02-24 MED ORDER — TRAMADOL HCL 50 MG PO TABS
50.0000 mg | ORAL_TABLET | Freq: Once | ORAL | Status: AC
Start: 2021-02-24 — End: 2021-02-24
  Administered 2021-02-24: 50 mg via ORAL
  Filled 2021-02-24: qty 1

## 2021-02-24 MED ORDER — ACETAMINOPHEN 325 MG PO TABS
650.0000 mg | ORAL_TABLET | Freq: Four times a day (QID) | ORAL | Status: DC | PRN
  Administered 2021-02-24: 650 mg via ORAL
  Filled 2021-02-24: qty 2

## 2021-02-24 MED ORDER — KETOROLAC TROMETHAMINE 15 MG/ML IJ SOLN
15.0000 mg | Freq: Once | INTRAMUSCULAR | Status: AC
  Administered 2021-02-24: 15 mg via INTRAVENOUS
  Filled 2021-02-24: qty 1

## 2021-02-24 NOTE — Discharge Instructions (Addendum)
Alternate ice and heat to areas of injury 3-4 times per day to limit inflammation and spasm.  Avoid strenuous activity and heavy lifting.  We recommend consistent use of naproxen in addition to Robaxin for muscle spasms. Do not drive or drink alcohol after taking Robaxin as it may make you drowsy and impair your judgment.  We recommend follow-up with a primary care doctor to ensure resolution of symptoms.  Return to the ED for any new or concerning symptoms. 

## 2021-02-24 NOTE — Discharge Instructions (Addendum)
Please see the list of resources below for substance abuse - I have given you a copy of the blood work that was done last night - you should be able to find a place to go to rehab if that is what you choose to do.  Please wash your skin with warm soapy water twice daily and apply topical antibiotic ointment with sterile dressings to the wounds after you shower.  I would like for you to return to the emergency department for any severe or worsening symptoms, if you do not have a family doctor see the phone number above  Thank you for letting us take care of you today!  Please obtain all of your results from medical records or have your doctors office obtain the results - share them with your doctor - you should be seen at your doctors office in the next 2 days. Call today to arrange your follow up. Take the medications as prescribed. Please review all of the medicines and only take them if you do not have an allergy to them. Please be aware that if you are taking birth control pills, taking other prescriptions, ESPECIALLY ANTIBIOTICS may make the birth control ineffective - if this is the case, either do not engage in sexual activity or use alternative methods of birth control such as condoms until you have finished the medicine and your family doctor says it is OK to restart them. If you are on a blood thinner such as COUMADIN, be aware that any other medicine that you take may cause the coumadin to either work too much, or not enough - you should have your coumadin level rechecked in next 7 days if this is the case.  ?  It is also a possibility that you have an allergic reaction to any of the medicines that you have been prescribed - Everybody reacts differently to medications and while MOST people have no trouble with most medicines, you may have a reaction such as nausea, vomiting, rash, swelling, shortness of breath. If this is the case, please stop taking the medicine immediately and contact your  physician.   If you were given a medication in the ED such as percocet, vicodin, or morphine, be aware that these medicines are sedating and may change your ability to take care of yourself adequately for several hours after being given this medicines - you should not drive or take care of small children if you were given this medicine in the Emergency Department or if you have been prescribed these types of medicines. ?   You should return to the ER IMMEDIATELY if you develop severe or worsening symptoms.   Substance Abuse Treatment Programs  Intensive Outpatient Programs [facility]     [address]      [place], [place]                   [phone]       [facility] [address] [place], [place] [phone]  [facility]     (Inpatient and outpatient)     [address]           [phone]    [facility] [phone] (Suboxone and Methadone)  [address]      [place], [place] [address]      [phone]       [address] [place], [place] [phone]  [facility] (Outpatient/Inpatient,  Chemical)    (insurance only) [phone]             [facility] (Groups & Residential) [place], [place] [phone]     [facility]     [address]     [place], [place]      [phone]       [facility] (for caregivers and family) [phone] [address]urell [address] [place], [place] [phone]      Residential Treatment Programs [facility]      [address], [place], [place] [address]  [phone]       [facility] [address] [place], [place] [address] [phone]  [facility] [place]        [phone]       [facility] [phone]  [facility] ([facility]             [address]                                         [place], [place]                                                [phone] or  [phone]                               [facility] [place] [address] [place], [address] [phone]  [facility]    [address]      [place], [place]     [phone]       [facility] [address] [place], [place] [phone]  [facility]   [address] [place], [place] [address]     [phone]      Admissions: 8am-3pm M-F  [facility] ([facility]) [address] [place], [place] [phone]  [facility]: Residential Program [phone] Days)   [place], [place]      [phone] or [phone]     [facility]: [facility] [place], [place] (Walk in Hours over the weekend or by referral)  [facility] [address] [place], [place], [place] [address] [phone]  Crisis Mobile: [facility]:  [phone] (for crisis response 24 hours a day) [facility] Hotline:      4373218276 Outpatient Psychiatry and Counseling  [facility]: Mobile Crisis Management 24 hours:  [phone]  [facility] of the [employer] sliding scale fee and walk in schedule: M-F 8am-12pm/1pm-3pm [address]  [place], [place] [address] [phone]  [facility] [address] [place], [place] [address] [phone]  [facility] (Formerly known as [facility] [employer])- new patient walk-in appointments available Monday - Friday 8am -3pm.          [address] [place], [place] [address] [phone] or crisis line- [phone]  [facility] Health Outpatient Services/ Intensive Outpatient Therapy Program [address] [place], [place] [address] [phone]  [facility]                  Crisis Services      [phone] [address]     [place], [place]  [address]                 [facility]   [facility] [phone]. [address] [place], [place] [address]   [facility]          [address]  [place], [place] [address]       [phone]  [facility] [address] [place], [place] [address] [phone]  [facility]    [address]    [place], [place] [address]     [phone]       Andee Poles, MD     [address]     [place] [address]     [phone]       [facility] [address] [place] [address]  [facility]     [address] [place], [place]      [phone]       [facility] Eulogio Ditch, MD [address] [place], [place] [address] [phone]  [facility]     [address]     [place], [place] [address]     [phone]       [facility] [address] [place], [place] [address] [phone] Resources for Temporary Residential Assistance/Crisis Centers  [facility]) M-F 8am-3pm   [address] [place], [place] [address]   [phone] Services include: laundry, barbering, support groups, case management, phone  & computer access, showers, AA/NA mtgs, mental health/substance abuse nurse, job skills class, disability information, VA assistance, spiritual classes, etc.   HOMELESS SHELTERS  [facility]     [facility]   [address] [place]     [phone]              [employer] (women and children)       [address] [place], [place] [address] [phone] [email or web address] for application and process Application Required  [facility]   [address]    [place] [address]     [phone]                    [facility] [address] [place], [place] [address] [phone] application appt.) Application  Required  [facility] (women only)    [address]     [place], [place] [address]     [phone]      Intake starts 6pm daily Need valid ID, SSC, & Police report [employer] [address] [place], [place] [phone] Application Required  [employer] (men only)     [address]      [place], [place]     [phone]       [facility] [employer] (Pregnant women only) [address] [place], [place] [phone]  [facility]      [address]      [place], [place] [address]     [phone]             [facility] [address] [place], [place] [phone] 90 day commitment/SA/Application process  [facility](men only)     [address]  [place], [place]     [phone]       Check-in at [facility] of [facility] [address] [place], [place] [address] [phone] Men/Women/Women and Children must be there by 7 pm  [facility] [place], [place] [phone]
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
## 2021-02-24 NOTE — ED Notes (Signed)
Patient arrived at ED room 3 by recliner. 2 person transfer required.
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
## 2021-02-24 NOTE — ED Notes (Signed)
  Patient was able to ambulate to wheelchair with minimal assistance.  Patient sister called and coming to pick patient up.  Patient placed in wheelchair and taken to lobby.
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
## 2021-02-24 NOTE — ED Notes (Signed)
Patient verbalizes understanding of discharge instructions. Resources reviewed. Education  provided on getting prescriptions filled that were prescribed yesterday. Family member at bedside had heard copy of prescriptions in hand. Opportunity for questioning and answers were provided. Armband removed by staff, pt discharged from ED via wheelchair. Pt was ambulatory from stretcher to wheelchair and paper scrubs were provided to pt.
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
## 2022-05-03 ENCOUNTER — Emergency Department (HOSPITAL_COMMUNITY)
Admission: EM | Admit: 2022-05-03 | Discharge: 2022-05-05 | Disposition: A | Discharge status: Home / Self Care | Payer: Medicaid Other | Attending: Emergency Medicine | Admitting: Emergency Medicine

## 2022-05-03 DIAGNOSIS — A5149 Other secondary syphilitic conditions: Secondary | ICD-10-CM

## 2022-05-03 DIAGNOSIS — F1220 Cannabis dependence, uncomplicated: Secondary | ICD-10-CM | POA: Diagnosis not present

## 2022-05-03 DIAGNOSIS — F1920 Other psychoactive substance dependence, uncomplicated: Secondary | ICD-10-CM | POA: Diagnosis not present

## 2022-05-03 DIAGNOSIS — F1410 Cocaine abuse, uncomplicated: Secondary | ICD-10-CM | POA: Insufficient documentation

## 2022-05-03 DIAGNOSIS — R45851 Suicidal ideations: Secondary | ICD-10-CM | POA: Insufficient documentation

## 2022-05-03 DIAGNOSIS — F1914 Other psychoactive substance abuse with psychoactive substance-induced mood disorder: Secondary | ICD-10-CM | POA: Diagnosis not present

## 2022-05-03 DIAGNOSIS — F1994 Other psychoactive substance use, unspecified with psychoactive substance-induced mood disorder: Secondary | ICD-10-CM

## 2022-05-03 DIAGNOSIS — F411 Generalized anxiety disorder: Secondary | ICD-10-CM | POA: Diagnosis not present

## 2022-05-03 DIAGNOSIS — Z046 Encounter for general psychiatric examination, requested by authority: Secondary | ICD-10-CM | POA: Insufficient documentation

## 2022-05-03 DIAGNOSIS — F1424 Cocaine dependence with cocaine-induced mood disorder: Secondary | ICD-10-CM | POA: Insufficient documentation

## 2022-05-03 DIAGNOSIS — A5139 Other secondary syphilis of skin: Secondary | ICD-10-CM | POA: Diagnosis not present

## 2022-05-03 DIAGNOSIS — F1414 Cocaine abuse with cocaine-induced mood disorder: Secondary | ICD-10-CM

## 2022-05-03 DIAGNOSIS — Z20822 Contact with and (suspected) exposure to covid-19: Secondary | ICD-10-CM | POA: Diagnosis not present

## 2022-05-03 DIAGNOSIS — R030 Elevated blood-pressure reading, without diagnosis of hypertension: Secondary | ICD-10-CM | POA: Diagnosis not present

## 2022-05-03 DIAGNOSIS — F1990 Other psychoactive substance use, unspecified, uncomplicated: Secondary | ICD-10-CM

## 2022-05-03 LAB — I-STAT BETA HCG BLOOD, ED (MC, WL, AP ONLY): I-stat hCG, quantitative: <5 m[IU]/mL (ref <5)

## 2022-05-03 LAB — CBC WITH DIFFERENTIAL/PLATELET
Abs Immature Granulocytes: 0.01 10*3/uL (ref 0.00–0.07)
Basophils Absolute: 0 10*3/uL (ref 0.0–0.1)
Basophils Relative: 1 %
Eosinophils Absolute: 0.1 10*3/uL (ref 0.0–0.5)
Eosinophils Relative: 1 %
HCT: 43 % (ref 36.0–46.0)
Hemoglobin: 14.2 g/dL (ref 12.0–15.0)
Immature Granulocytes: 0 %
Lymphocytes Relative: 37 %
Lymphs Abs: 1.5 10*3/uL (ref 0.7–4.0)
MCH: 30 pg (ref 26.0–34.0)
MCHC: 33 g/dL (ref 30.0–36.0)
MCV: 90.9 fL (ref 80.0–100.0)
Monocytes Absolute: 0.4 10*3/uL (ref 0.1–1.0)
Monocytes Relative: 9 %
Neutro Abs: 2 10*3/uL (ref 1.7–7.7)
Neutrophils Relative %: 52 %
Platelets: 334 10*3/uL (ref 150–400)
RBC: 4.73 MIL/uL (ref 3.87–5.11)
RDW: 14.5 % (ref 11.5–15.5)
WBC: 3.9 10*3/uL — ABNORMAL LOW (ref 4.0–10.5)
nRBC: 0 % (ref 0.0–0.2)

## 2022-05-03 LAB — COMPREHENSIVE METABOLIC PANEL
ALT: 13 U/L (ref 0–44)
AST: 24 U/L (ref 15–41)
Albumin: 3.1 g/dL — ABNORMAL LOW (ref 3.5–5.0)
Alkaline Phosphatase: 112 U/L (ref 38–126)
Anion gap: 9 (ref 5–15)
BUN: 9 mg/dL (ref 6–20)
CO2: 25 mmol/L (ref 22–32)
Calcium: 8.7 mg/dL — ABNORMAL LOW (ref 8.9–10.3)
Chloride: 103 mmol/L (ref 98–111)
Creatinine, Ser: 0.87 mg/dL (ref 0.44–1.00)
GFR, Estimated: >60 mL/min (ref >60)
Glucose, Bld: 75 mg/dL (ref 70–99)
Potassium: 3.7 mmol/L (ref 3.5–5.1)
Sodium: 137 mmol/L (ref 135–145)
Total Bilirubin: 0.4 mg/dL (ref 0.3–1.2)
Total Protein: 8.5 g/dL — ABNORMAL HIGH (ref 6.5–8.1)

## 2022-05-03 LAB — CBG MONITORING, ED: Glucose-Capillary: 84 mg/dL (ref 70–99)

## 2022-05-03 NOTE — ED Notes (Signed)
Refused EKG
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
## 2022-05-03 NOTE — ED Provider Triage Note (Signed)
Emergency Medicine Provider Triage Evaluation Note  Michele Yu , a 46 y.o. female  was evaluated in triage.  Pt presents with GPD under IVC by her sister with concern for hypersexual activity, suicidality, and poor hygiene.  Patient states she was sleeping tonight and does not know why she was brought into the ED.  Does state that she does not have any desire to live any longer.  Review of Systems  Positive: Suicidality, poor hygiene, high risk activity Negative: Pain, HI, AVH  Physical Exam  BP (!) 148/121 (BP Location: Right Arm)   Pulse 93   Temp 99.2 F (37.3 C)   Resp 18   SpO2 100%  Gen:   Awake, no distress   Resp:  Normal effort  MSK:   Moves extremities without difficulty  Other:  RRR no M setters G.  Lungs CTA B.  Medical Decision Making  Medically screening exam initiated at 10:27 PM.  Appropriate orders placed.  Michele Yu was informed that the remainder of the evaluation will be completed by another provider, this initial triage assessment does not replace that evaluation, and the importance of remaining in the ED until their evaluation is complete.  This chart was dictated using voice recognition software, Dragon. Despite the best efforts of this provider to proofread and correct errors, errors may still occur which can change documentation meaning.     Paris Lore, PA-C 05/03/22 2235

## 2022-05-04 DIAGNOSIS — F411 Generalized anxiety disorder: Secondary | ICD-10-CM

## 2022-05-04 DIAGNOSIS — F1414 Cocaine abuse with cocaine-induced mood disorder: Secondary | ICD-10-CM

## 2022-05-04 LAB — URINALYSIS, ROUTINE W REFLEX MICROSCOPIC
Bilirubin Urine: NEGATIVE
Glucose, UA: NEGATIVE mg/dL
Ketones, ur: NEGATIVE mg/dL
Nitrite: POSITIVE — AB
Protein, ur: 100 mg/dL — AB
Specific Gravity, Urine: 1.028 (ref 1.005–1.030)
pH: 6 (ref 5.0–8.0)

## 2022-05-04 LAB — RESP PANEL BY RT-PCR (FLU A&B, COVID) ARPGX2
Influenza A by PCR: NEGATIVE
Influenza B by PCR: NEGATIVE
SARS Coronavirus 2 by RT PCR: NEGATIVE

## 2022-05-04 LAB — RAPID URINE DRUG SCREEN, HOSP PERFORMED
Amphetamines: NOT DETECTED
Barbiturates: NOT DETECTED
Benzodiazepines: NOT DETECTED
Cocaine: POSITIVE — AB
Opiates: NOT DETECTED
Tetrahydrocannabinol: POSITIVE — AB

## 2022-05-04 LAB — ACETAMINOPHEN LEVEL: Acetaminophen (Tylenol), Serum: <10 ug/mL — ABNORMAL LOW (ref 10–30)

## 2022-05-04 LAB — RPR
RPR Ser Ql: REACTIVE — AB
RPR Titer: 1:64 {titer}

## 2022-05-04 LAB — SARS CORONAVIRUS 2 BY RT PCR: SARS Coronavirus 2 by RT PCR: NEGATIVE

## 2022-05-04 LAB — HIV ANTIBODY (ROUTINE TESTING W REFLEX): HIV Screen 4th Generation wRfx: NONREACTIVE

## 2022-05-04 LAB — ETHANOL: Alcohol, Ethyl (B): <10 mg/dL (ref <10)

## 2022-05-04 LAB — SALICYLATE LEVEL: Salicylate Lvl: <7 mg/dL — ABNORMAL LOW (ref 7.0–30.0)

## 2022-05-04 MED ORDER — LORAZEPAM 1 MG PO TABS: 0.0000 mg | ORAL_TABLET | Freq: Two times a day (BID) | ORAL | Status: DC

## 2022-05-04 MED ORDER — LORAZEPAM 2 MG/ML IJ SOLN: 0.0000 mg | Freq: Four times a day (QID) | INTRAMUSCULAR | Status: DC

## 2022-05-04 MED ORDER — IBUPROFEN 400 MG PO TABS: 400.0000 mg | ORAL_TABLET | Freq: Three times a day (TID) | ORAL | Status: DC | PRN

## 2022-05-04 MED ORDER — ONDANSETRON HCL 4 MG PO TABS: 4.0000 mg | ORAL_TABLET | Freq: Three times a day (TID) | ORAL | Status: DC | PRN

## 2022-05-04 MED ORDER — GABAPENTIN 100 MG PO CAPS
200.0000 mg | ORAL_CAPSULE | Freq: Three times a day (TID) | ORAL | Status: DC
  Administered 2022-05-04 – 2022-05-05 (×3): 200 mg via ORAL
  Filled 2022-05-04 (×3): qty 2

## 2022-05-04 MED ORDER — LORAZEPAM 2 MG/ML IJ SOLN: 0.0000 mg | Freq: Two times a day (BID) | INTRAMUSCULAR | Status: DC

## 2022-05-04 MED ORDER — THIAMINE HCL 100 MG/ML IJ SOLN
100.0000 mg | Freq: Every day | INTRAMUSCULAR | Status: DC
  Filled 2022-05-04 (×2): qty 1

## 2022-05-04 MED ORDER — LORAZEPAM 1 MG PO TABS: 0.0000 mg | ORAL_TABLET | Freq: Four times a day (QID) | ORAL | Status: DC

## 2022-05-04 MED ORDER — THIAMINE MONONITRATE 100 MG PO TABS
100.0000 mg | ORAL_TABLET | Freq: Every day | ORAL | Status: DC
  Administered 2022-05-04 – 2022-05-05 (×2): 100 mg via ORAL
  Filled 2022-05-04 (×2): qty 1

## 2022-05-04 MED ORDER — ESCITALOPRAM OXALATE 10 MG PO TABS
5.0000 mg | ORAL_TABLET | Freq: Every day | ORAL | Status: DC
  Administered 2022-05-04: 5 mg via ORAL
  Filled 2022-05-04: qty 1

## 2022-05-04 NOTE — ED Notes (Signed)
Pt accepts offer for shower- clean scrubs and linen given. Clean brief given.

## 2022-05-04 NOTE — ED Notes (Signed)
ED Provider at bedside. 

## 2022-05-04 NOTE — Consult Note (Signed)
Telepsych Consultation   Reason for Consult:  Psychiatric Reassessment  Referring Physician:  Ripley Fraise, MD Location of Patient:    [facility] Location of Provider: Other: virtual home office   Patient Identification: Michele Yu MRN:  [id number] Principal Diagnosis: Cocaine abuse with cocaine-induced mood disorder ([place]) Diagnosis:  Principal Problem:   Cocaine abuse with cocaine-induced mood disorder ([place]) Active Problems:   Marijuana dependence ([place])   Generalized anxiety disorder   Total Time spent with patient: 30 minutes  Subjective:   Michele Yu is a 46 y.o. female patient admitted via IVC for suicidal ideations associated with substance abuse.   HPI:   Patient seen via telepsych by this provider; chart reviewed and consulted with Dr. Dwyane Dee on 05/04/22.  On evaluation Michele Yu reports she does not want to say why she is here.  States her sister called the police on her because, "I'm a drug addict and the police came and escorted me to the hospital." She denies verbal or physical altercations.    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  Initial use was 10 years ago "as an experiment" and she could not get off.  She cannot recall a time when she did not use within the past  10 years.  Denies attempts at rehab.  Her UDS is + for [facility] but she does not recall marijuana use.  She denies alcohol usage. She reports she has family, sister, mother who live close by.  When asked if they are supportive of her she states, "yes, I guess so, that's why I'm here."   She denies history of self harming behaviors, prior suicidal attempt or inpatient psychiatric hospitalizations.   She denies history for mental illness and denies hx of psychiatric medication usage.  She reports she's not being followed by primary care provider, or  psychiatric provider.   She states she wants to get treatment to get off the drugs. [place] services were explained to the patient and she agrees to transfer if a bed is available. orts she slept good last night.     During evaluation Michele Yu is laying upright in hospital bed; S he is alert/oriented to name and location, aware today is Friday but unsure of current date. She is anxious but cooperative; and mood congruent with affect.  Patient is speaking in a clear tone at moderate volume, and normal pace; with good eye contact.  Her thought process is coherent and relevant; There is no indication that she is currently responding to internal/external stimuli or experiencing delusional thought content.  Patient denies suicidal/self-harm/homicidal ideation, psychosis, and paranoia.  Patient has remained calm throughout assessment and has answered questions appropriately.    Per ED Provider Admissions Assessment 05/03/2022 Chief Complaint  Patient presents with   Psychiatric Evaluation      Michele Yu is a 46 y.o. female.   HPI   Patient brought to the hospital by police with IVC paperwork.  This reported patient's been having suicidal thoughts with associated substance abuse. On my evaluation patient denies any complaints.  She is resting comfortably.  She does not know why she is in the ER  Past Psychiatric History: anxiety, depression, panic attacks, crack cocaine abuse  Risk to Self:  no Risk to Others:  no Prior Inpatient Therapy: denies  Prior Outpatient Therapy:  denies  Past  Medical History:  Past Medical History:  Diagnosis Date   Anxiety    Daily headache    Depression    Genital HSV    Hypertension 2003   "just right after I had my 1st baby"   Panic attacks    Stomach ulcer     Past Surgical History:  Procedure Laterality Date   APPENDECTOMY  1992   DILATION AND CURETTAGE OF UTERUS     S/P "miscarriage"   Family History:  Family History  Problem Relation Age of  Onset   Heart attack Father    Family Psychiatric  History: denies familial hx of mental illness Social History:  Social History   Substance and Sexual Activity  Alcohol Use Yes   Alcohol/week: 14.0 standard drinks of alcohol   Types: 14 Glasses of wine per week   Comment: drinks 40oz beer daily.     Social History   Substance and Sexual Activity  Drug Use Yes   Types: Marijuana    Social History   Socioeconomic History   Marital status: Legally Separated    Spouse name: Not on file   Number of children: Not on file   Years of education: Not on file   Highest education level: Not on file  Occupational History   Not on file  Tobacco Use   Smoking status: Every Day    Packs/day: 1.00    Years: 19.00    Total pack years: 19.00    Types: Cigarettes   Smokeless tobacco: Never  Vaping Use   Vaping Use: Former  Substance and Sexual Activity   Alcohol use: Yes    Alcohol/week: 14.0 standard drinks of alcohol    Types: 14 Glasses of wine per week    Comment: drinks 40oz beer daily.   Drug use: Yes    Types: Marijuana   Sexual activity: Yes    Birth control/protection: Condom, Spermicide  Other Topics Concern   Not on file  Social History Narrative   Not on file   Social Determinants of Health   Financial Resource Strain: Not on file  Food Insecurity: Not on file  Transportation Needs: Not on file  Physical Activity: Not on file  Stress: Not on file  Social Connections: Not on file   Additional Social History:    Allergies:  No Known Allergies  Labs:  Results for orders placed or performed during the hospital encounter of 05/03/22 (from the past 48 hour(s))  Resp Panel by RT-PCR (Flu A&B, Covid) Anterior Nasal Swab     Status: None   Collection Time: 05/03/22 10:28 PM   Specimen: Anterior Nasal Swab  Result Value Ref Range   SARS Coronavirus 2 by RT PCR NEGATIVE NEGATIVE    Comment: (NOTE) SARS-CoV-2 target nucleic acids are NOT DETECTED.  The  SARS-CoV-2 RNA is generally detectable in upper respiratory specimens during the acute phase of infection. The lowest concentration of SARS-CoV-2 viral copies this assay can detect is 138 copies/mL. A negative result does not preclude SARS-Cov-2 infection and should not be used as the sole basis for treatment or other patient management decisions. A negative result may occur with  improper specimen collection/handling, submission of specimen other than nasopharyngeal swab, presence of viral mutation(s) within the areas targeted by this assay, and inadequate number of viral copies(<138 copies/mL). A negative result must be combined with clinical observations, patient history, and epidemiological information. The expected result is Negative.  Fact Sheet for Patients:  EntrepreneurPulse.com.au  Fact Sheet for  Healthcare Providers:  SeriousBroker.it  This test is no t yet approved or cleared by the [place] and  has been authorized for detection and/or diagnosis of SARS-CoV-2 by FDA under an Emergency Use Authorization (EUA). This EUA will remain  in effect (meaning this test can be used) for the duration of the COVID-19 declaration under Section 564(b)(1) of the Act, 21 U.S.C.section 360bbb-3(b)(1), unless the authorization is terminated  or revoked sooner.       Influenza A by PCR NEGATIVE NEGATIVE   Influenza B by PCR NEGATIVE NEGATIVE    Comment: (NOTE) The Xpert Xpress SARS-CoV-2/FLU/RSV plus assay is intended as an aid in the diagnosis of influenza from Nasopharyngeal swab specimens and should not be used as a sole basis for treatment. Nasal washings and aspirates are unacceptable for Xpert Xpress SARS-CoV-2/FLU/RSV testing.  Fact Sheet for Patients: BloggerCourse.com  Fact Sheet for Healthcare Providers: SeriousBroker.it  This test is not yet approved or cleared by the  [place] FDA and has been authorized for detection and/or diagnosis of SARS-CoV-2 by FDA under an Emergency Use Authorization (EUA). This EUA will remain in effect (meaning this test can be used) for the duration of the COVID-19 declaration under Section 564(b)(1) of the Act, 21 U.S.C. section 360bbb-3(b)(1), unless the authorization is terminated or revoked.  Performed at [facility], [address] [place], [place] [address]   Comprehensive metabolic panel     Status: Abnormal   Collection Time: 05/03/22 10:47 PM  Result Value Ref Range   Sodium 137 135 - 145 mmol/L   Potassium 3.7 3.5 - 5.1 mmol/L   Chloride 103 98 - 111 mmol/L   CO2 25 22 - 32 mmol/L   Glucose, Bld 75 70 - 99 mg/dL    Comment: Glucose reference range applies only to samples taken after fasting for at least 8 hours.   BUN 9 6 - 20 mg/dL   Creatinine, Ser 1.75 0.44 - 1.00 mg/dL   Calcium 8.7 (L) 8.9 - 10.3 mg/dL   Total Protein 8.5 (H) 6.5 - 8.1 g/dL   Albumin 3.1 (L) 3.5 - 5.0 g/dL   AST 24 15 - 41 U/L   ALT 13 0 - 44 U/L   Alkaline Phosphatase 112 38 - 126 U/L   Total Bilirubin 0.4 0.3 - 1.2 mg/dL   GFR, Estimated >10 >25 mL/min    Comment: (NOTE) Calculated using the CKD-EPI Creatinine Equation (2021)    Anion gap 9 5 - 15    Comment: Performed at [facility], [address], [place], [place] [address]  CBC with Diff     Status: Abnormal   Collection Time: 05/03/22 10:47 PM  Result Value Ref Range   WBC 3.9 (L) 4.0 - 10.5 K/uL   RBC 4.73 3.87 - 5.11 MIL/uL   Hemoglobin 14.2 12.0 - 15.0 g/dL   HCT 82.4 23.5 - 36.1 %   MCV 90.9 80.0 - 100.0 fL   MCH 30.0 26.0 - 34.0 pg   MCHC 33.0 30.0 - 36.0 g/dL   RDW 44.3 15.4 - 00.8 %   Platelets 334 150 - 400 K/uL   nRBC 0.0 0.0 - 0.2 %   Neutrophils Relative % 52 %   Neutro Abs 2.0 1.7 - 7.7 K/uL   Lymphocytes Relative 37 %   Lymphs Abs 1.5 0.7 - 4.0 K/uL   Monocytes Relative 9 %   Monocytes Absolute 0.4 0.1 - 1.0 K/uL   Eosinophils  Relative  1 %   Eosinophils Absolute 0.1 0.0 - 0.5 K/uL   Basophils Relative 1 %   Basophils Absolute 0.0 0.0 - 0.1 K/uL   Immature Granulocytes 0 %   Abs Immature Granulocytes 0.01 0.00 - 0.07 K/uL    Comment: Performed at [facility], [address] [place], [place] [address]  HIV Antibody (routine testing w rflx)     Status: None   Collection Time: 05/03/22 10:47 PM  Result Value Ref Range   HIV Screen 4th Generation wRfx Non Reactive Non Reactive    Comment: Performed at [facility], [address], [place], [place] [address]  RPR     Status: Abnormal   Collection Time: 05/03/22 10:47 PM  Result Value Ref Range   RPR Ser Ql Reactive (A) NON REACTIVE    Comment: SENT FOR CONFIRMATION   RPR Titer 1:64     Comment: Performed at [facility], [address] [place], [place] [address]  Ethanol     Status: None   Collection Time: 05/03/22 10:48 PM  Result Value Ref Range   Alcohol, Ethyl (B) <10 <10 mg/dL    Comment: (NOTE) Lowest detectable limit for serum alcohol is 10 mg/dL.  For medical purposes only. Performed at [facility], [address] [place], [place] [address]   Salicylate level     Status: Abnormal   Collection Time: 05/03/22 10:48 PM  Result Value Ref Range   Salicylate Lvl <7.0 (L) 7.0 - 30.0 mg/dL    Comment: Performed at [facility], [address], [place], [place] [address]  Acetaminophen level     Status: Abnormal   Collection Time: 05/03/22 10:48 PM  Result Value Ref Range   Acetaminophen (Tylenol), Serum <10 (L) 10 - 30 ug/mL    Comment: (NOTE) Therapeutic concentrations vary significantly. A range of 10-30 ug/mL  may be an effective concentration for many patients. However, some  are best treated at concentrations outside of this range. Acetaminophen concentrations >150 ug/mL at 4 hours after ingestion  and >50 ug/mL at 12 hours after ingestion are often associated with  toxic reactions.  Performed at [facility], [address] [place], [place] [address]   CBG monitoring, ED     Status: None   Collection Time: 05/03/22 10:53 PM  Result Value Ref Range   Glucose-Capillary 84 70 - 99 mg/dL    Comment: Glucose reference range applies only to samples taken after fasting for at least 8 hours.  I-Stat beta hCG blood, ED     Status: None   Collection Time: 05/03/22 11:21 PM  Result Value Ref Range   I-stat hCG, quantitative <5.0 <5 mIU/mL   Comment 3            Comment:   GEST. AGE      CONC.  (mIU/mL)   <=1 WEEK        5 - 50     2 WEEKS       50 - 500     3 WEEKS       100 - 10,000     4 WEEKS     1,000 - 30,000        FEMALE AND NON-PREGNANT FEMALE:     LESS THAN 5 mIU/mL     Medications:  Current Facility-Administered Medications  Medication Dose Route Frequency Provider Last Rate Last Admin   escitalopram (LEXAPRO) tablet 5 mg  5 mg Oral QHS Merlyn Lot E, NP       gabapentin (NEURONTIN) capsule 200 mg  200 mg Oral TID Merlyn Lot E, NP       ibuprofen (ADVIL) tablet 400 mg  400 mg Oral Q8H PRN Ripley Fraise, MD       LORazepam (ATIVAN) injection 0-4 mg  0-4 mg Intravenous Q6H Ripley Fraise, MD       Or   LORazepam (ATIVAN) tablet 0-4 mg  0-4 mg Oral Q6H Ripley Fraise, MD       [START ON 05/06/2022] LORazepam (ATIVAN) injection 0-4 mg  0-4 mg Intravenous Q12H Ripley Fraise, MD       Or   Derrill Memo ON 05/06/2022] LORazepam (ATIVAN) tablet 0-4 mg  0-4 mg Oral Q12H Ripley Fraise, MD       ondansetron [facility]) tablet 4 mg  4 mg Oral Q8H PRN Ripley Fraise, MD       thiamine (VITAMIN B1) tablet 100 mg  100 mg Oral Daily Ripley Fraise, MD       Or   thiamine (VITAMIN B1) injection 100 mg  100 mg Intravenous Daily Ripley Fraise, MD       Current Outpatient Medications  Medication Sig Dispense Refill   azaTHIOprine (IMURAN) 50 MG tablet One po qAM and two po qHS (Patient not taking: Reported on 05/04/2022) 270 tablet 3   buPROPion (WELLBUTRIN XL) 300 MG 24 hr  tablet Take 1 tablet (300 mg total) by mouth daily. (Patient not taking: Reported on 02/14/2019) 90 tablet 3   oxybutynin (DITROPAN) 5 MG tablet Take 1 tablet (5 mg total) by mouth 2 (two) times daily. (Patient not taking: Reported on 02/14/2019) 180 tablet 3   predniSONE (DELTASONE) 10 MG tablet Take 2 tablets (20 mg total) by mouth daily with breakfast. (Patient not taking: Reported on 02/14/2019) 90 tablet 11   traZODone (DESYREL) 50 MG tablet One or two at bedtime (Patient not taking: Reported on 05/04/2022) 60 tablet 5   Vitamin D, Ergocalciferol, (DRISDOL) 1.25 MG (50000 UT) CAPS capsule Take 1 capsule (50,000 Units total) by mouth every 7 (seven) days. (Patient not taking: Reported on 02/14/2019) 13 capsule 3   Facility-Administered Medications Ordered in Other Encounters  Medication Dose Route Frequency Provider Last Rate Last Admin   gadopentetate dimeglumine (MAGNEVIST) injection 10 mL  10 mL Intravenous Once PRN Sater, Nanine Means, MD       gadopentetate dimeglumine (MAGNEVIST) injection 10 mL  10 mL Intravenous Once PRN Sater, Nanine Means, MD        Musculoskeletal: pt moves all extremities and ambulates independently Strength & Muscle Tone: within normal limits Gait & Station: normal Patient leans: N/A   Psychiatric Specialty Exam:  Presentation  General Appearance: Appropriate for Environment; Fairly Groomed Eye Contact:Good Speech:Clear and Coherent; Normal Rate Speech Volume:Normal Handedness:Right  Mood and Affect  Mood:Anxious Affect:Appropriate; Congruent  Thought Process  Thought Processes:Coherent; Goal Directed Descriptions of Associations:Intact  Orientation:Partial (pt not sure of date but aware "Biden" is president)  Thought Content:Logical  History of Schizophrenia/Schizoaffective disorder:No data recorded Duration of Psychotic Symptoms:No data recorded Hallucinations:Hallucinations: None  Ideas of Reference:None  Suicidal Thoughts:Suicidal Thoughts:  No  Homicidal Thoughts:Homicidal Thoughts: No   Sensorium  Memory:Immediate Good; Recent Fair; Remote Fair Judgment:-- (impulsive as evidenced by chronic crack cocaine abuse) Insight:Fair (admits she has an addiction but cannot name underlying mental health concerns that may be contributed to this)  Executive Functions  Concentration:Fair (pt appears distracted by wanting to eat  breakfast; at the time of call she was unaware it was in her room .) Attention Span:Fair Gay Language:Good  Psychomotor Activity  Psychomotor Activity:Psychomotor Activity: Normal  Assets  Assets:Communication Skills; Desire for Improvement; Social Support  Sleep  Sleep:Sleep: Good Number of Hours of Sleep: 7   Physical Exam: Physical Exam Cardiovascular:     Pulses: Normal pulses.  Pulmonary:     Effort: Pulmonary effort is normal.  Neurological:     Mental Status: She is alert.  Psychiatric:        Attention and Perception: Attention and perception normal.        Mood and Affect: Mood is anxious. Affect is blunt.        Speech: Speech normal.        Behavior: Behavior normal. Behavior is cooperative.        Thought Content: Thought content is not paranoid or delusional. Thought content does not include homicidal or suicidal ideation. Thought content does not include homicidal or suicidal plan.        Cognition and Memory: Cognition and memory normal.        Judgment: Judgment is impulsive and inappropriate.    Review of Systems  Constitutional: Negative.   HENT: Negative.    Eyes: Negative.   Respiratory: Negative.    Gastrointestinal: Negative.   Genitourinary: Negative.   Musculoskeletal: Negative.   Skin: Negative.   Neurological: Negative.   Endo/Heme/Allergies: Negative.   Psychiatric/Behavioral:  Positive for substance abuse. Negative for depression, memory loss and suicidal ideas. The patient is nervous/anxious. The patient does not have  insomnia.    Blood pressure (!) 142/105, pulse 91, temperature 99 F (37.2 C), resp. rate 16, SpO2 99 %. There is no height or weight on file to calculate BMI.  Treatment Plan Summary: Patient with hx of depression, anxiety and crack cocaine abuse presents to the emergency department via IVC for suicidal ideations associated with drug use.  The petitioner is her sister.  On assessment today, pt is alert and oriented to name, day of week but has some problems remembering the date. She is aware [facility] hospit[facility] but guarded about the historical events that led to current hospitalization.  She endorses 10 year history for crack cocaine abuse, last use was prior to admission.  Since admission, she was placed on CIWA ativan protocal, currently denies withdrawal symptoms but she does appear anxious and is somewhat fidgety; Considering this, pt does not meet criteria for IVC and is not interested in inpatient psychiatric admissions. However, she is interested is seeking care for substance abuse and agrees to transfer to [facility].    Her UDS was positive for THC only.  LFTs are WNL; WBC-marginally low at 3.9 ( normal range is 4.0-10.5) but she does not appear ill today; no current TSH. Urine pregnancy is negative.  Based on her history of chronic drug abuse, I have requested a baseline EKG to evaluate for prolonged QT/QTC intervals prior to starting psychiatric medications.  She will also need an covid test.  Pending results patient to be evaluated by [facility] for transfer today.   Medications: Continue CIWA Ativan protocol for now Start  Gabapentin 200mg  po TID for anxiety associated withdrawals Escitalopram 5mg  po qhs for depression/anxiety 3.  Trazodone 50mg  po qhs prn sleep   Disposition: No evidence of imminent risk to self or others at present.   Patient does not meet criteria for psychiatric inpatient admission. Supportive therapy provided about ongoing stressors.  This service was provided via  telemedicine using a 2-way, interactive audio and video technology.  Above has been discussed with Drs. Melba Coon and Merrily Brittle at [facility]: Blima Singer Plueger, RN.  Names of all persons participating in this telemedicine service and their role in this encounter. Name: Michele Yu Role: Patient  Name: Merlyn Lot Role: PMHNP   Mallie Darting, NP 05/04/2022 12:19 PM

## 2022-05-04 NOTE — ED Provider Notes (Signed)
[facility] EMERGENCY DEPARTMENT Provider Note   CSN: [id number] Arrival date & time: 05/03/22  2133     History  Chief Complaint  Patient presents with   Psychiatric Evaluation    Michele Yu is a 46 y.o. female.  HPI    Patient brought to the hospital by police with IVC paperwork.  This reported patient's been having suicidal thoughts with associated substance abuse. On my evaluation patient denies any complaints.  She is resting comfortably.  She does not know why she is in the ER Home Medications Prior to Admission medications   Medication Sig Start Date End Date Taking? Authorizing Provider  azaTHIOprine (IMURAN) 50 MG tablet One po qAM and two po qHS 03/10/19   Sater, Richard A, MD  buPROPion (WELLBUTRIN XL) 300 MG 24 hr tablet Take 1 tablet (300 mg total) by mouth daily. Patient not taking: Reported on 02/14/2019 09/09/18   Asa Lente, MD  oxybutynin (DITROPAN) 5 MG tablet Take 1 tablet (5 mg total) by mouth 2 (two) times daily. Patient not taking: Reported on 02/14/2019 01/09/18   Asa Lente, MD  predniSONE (DELTASONE) 10 MG tablet Take 2 tablets (20 mg total) by mouth daily with breakfast. Patient not taking: Reported on 02/14/2019 09/09/18   Asa Lente, MD  traZODone (DESYREL) 50 MG tablet One or two at bedtime 03/10/19   Sater, Pearletha Furl, MD  Vitamin D, Ergocalciferol, (DRISDOL) 1.25 MG (50000 UT) CAPS capsule Take 1 capsule (50,000 Units total) by mouth every 7 (seven) days. Patient not taking: Reported on 02/14/2019 09/15/18   Asa Lente, MD      Allergies    Patient has no known allergies.    Review of Systems   Review of Systems  Physical Exam Updated Vital Signs BP (!) 145/86   Pulse 85   Temp 98 F (36.7 C)   Resp 16   SpO2 98%  Physical Exam CONSTITUTIONAL: Disheveled, no acute distress HEAD: Normocephalic/atraumatic EYES: EOMI ENMT: Mucous membranes moist NECK: supple no meningeal signs CV: S1/S2 noted, no  murmurs/rubs/gallops noted LUNGS: Lungs are clear to auscultation bilaterally, no apparent distress ABDOMEN: soft, nontender NEURO: Pt is resting comfortably, easily arousable in no acute distress She is able to move all extremities without difficulty EXTREMITIES: pulses normal/equal, full ROM, no deformities SKIN: warm, color normal PSYCH: no abnormalities of mood noted, alert and oriented to situation  ED Results / Procedures / Treatments   Labs (all labs ordered are listed, but only abnormal results are displayed) Labs Reviewed  COMPREHENSIVE METABOLIC PANEL - Abnormal; Notable for the following components:      Result Value   Calcium 8.7 (*)    Total Protein 8.5 (*)    Albumin 3.1 (*)    All other components within normal limits  CBC WITH DIFFERENTIAL/PLATELET - Abnormal; Notable for the following components:   WBC 3.9 (*)    All other components within normal limits  SALICYLATE LEVEL - Abnormal; Notable for the following components:   Salicylate Lvl <7.0 (*)    All other components within normal limits  ACETAMINOPHEN LEVEL - Abnormal; Notable for the following components:   Acetaminophen (Tylenol), Serum <10 (*)    All other components within normal limits  RESP PANEL BY RT-PCR (FLU A&B, COVID) ARPGX2  ETHANOL  HIV ANTIBODY (ROUTINE TESTING W REFLEX)  RAPID URINE DRUG SCREEN, HOSP PERFORMED  URINALYSIS, ROUTINE W REFLEX MICROSCOPIC  RPR  I-STAT BETA HCG BLOOD, ED ([facility], [facility], [facility]  ONLY)  CBG MONITORING, ED    EKG None  Radiology No results found.  Procedures Procedures    Medications Ordered in ED Medications  ibuprofen (ADVIL) tablet 400 mg (has no administration in time range)  LORazepam (ATIVAN) injection 0-4 mg (has no administration in time range)    Or  LORazepam (ATIVAN) tablet 0-4 mg (has no administration in time range)  LORazepam (ATIVAN) injection 0-4 mg (has no administration in time range)    Or  LORazepam (ATIVAN) tablet 0-4 mg (has no  administration in time range)  thiamine (VITAMIN B1) tablet 100 mg (has no administration in time range)    Or  thiamine (VITAMIN B1) injection 100 mg (has no administration in time range)  ondansetron (ZOFRAN) tablet 4 mg (has no administration in time range)    ED Course/ Medical Decision Making/ A&P Clinical Course as of 05/04/22 0608  Fri May 04, 2022  0601 Patient arrived with police under IVC.  Per IVC paperwork, patient had reported she wanted to harm herself.  She reported she did not care about living.  Apparently she was lying on the porch without any underwear and has been without sleep for up to 4 days. [DW]  0601 Patient is overall medically stable.  Labs are overall reassuring.  IVC will be completed.  Awaiting psychiatric evaluation [DW]  0606 Per records, patient has a history of a neurologic demyelinating disorder, but she appears to have been lost to follow-up.  At this point there is no signs of any acute neurologic or medical emergency.  She is awaiting psychiatric evaluation [DW]    Clinical Course User Index [DW] Zadie Rhine, MD                           Medical Decision Making Risk OTC drugs. Prescription drug management.           Final Clinical Impression(s) / ED Diagnoses Final diagnoses:  Suicidal ideation    Rx / DC Orders ED Discharge Orders     None         Zadie Rhine, MD 05/04/22 [phone]

## 2022-05-04 NOTE — ED Notes (Signed)
Pt refusing gabapentin at this time. Informed of use and denies wanting to take it. No complains at this time- denies anxiety and pain.

## 2022-05-04 NOTE — ED Notes (Signed)
Patient on tts

## 2022-05-05 ENCOUNTER — Ambulatory Visit (HOSPITAL_COMMUNITY)
Admission: EM | Admit: 2022-05-05 | Discharge: 2022-05-05 | Disposition: A | Discharge status: Home / Self Care | Payer: Medicaid Other | Attending: Psychiatry | Admitting: Psychiatry

## 2022-05-05 DIAGNOSIS — Z5900 Homelessness unspecified: Secondary | ICD-10-CM | POA: Insufficient documentation

## 2022-05-05 DIAGNOSIS — F1414 Cocaine abuse with cocaine-induced mood disorder: Secondary | ICD-10-CM | POA: Insufficient documentation

## 2022-05-05 DIAGNOSIS — A539 Syphilis, unspecified: Secondary | ICD-10-CM | POA: Insufficient documentation

## 2022-05-05 DIAGNOSIS — L989 Disorder of the skin and subcutaneous tissue, unspecified: Secondary | ICD-10-CM | POA: Insufficient documentation

## 2022-05-05 MED ORDER — PENICILLIN G BENZATHINE 1200000 UNIT/2ML IM SUSY
2.4000 10*6.[IU] | PREFILLED_SYRINGE | Freq: Once | INTRAMUSCULAR | Status: AC
  Administered 2022-05-05: 2.4 10*6.[IU] via INTRAMUSCULAR
  Filled 2022-05-05: qty 4

## 2022-05-05 MED ORDER — STERILE WATER FOR INJECTION IJ SOLN
INTRAMUSCULAR | Status: AC
  Filled 2022-05-05: qty 10

## 2022-05-05 MED ORDER — CEFTRIAXONE SODIUM 500 MG IJ SOLR
500.0000 mg | Freq: Once | INTRAMUSCULAR | Status: AC
  Administered 2022-05-05: 500 mg via INTRAMUSCULAR
  Filled 2022-05-05: qty 500

## 2022-05-05 MED ORDER — AZITHROMYCIN 500 MG PO TABS
1000.0000 mg | ORAL_TABLET | Freq: Once | ORAL | Status: AC
  Administered 2022-05-05: 1000 mg via ORAL
  Filled 2022-05-05: qty 2

## 2022-05-05 NOTE — ED Notes (Signed)
Patient awoke and reported that she had a bowel movement on self.  Patient went to restroom and given supplies to clean herself up.  New linen placed on bed.

## 2022-05-05 NOTE — ED Notes (Signed)
AVS and taxi voucher given to pt  who states she is going to her mothers home on [address] in [place]

## 2022-05-05 NOTE — ED Notes (Signed)
Report given to [facility] at [facility].

## 2022-05-05 NOTE — Care Management (Signed)
PCP suggested in patient instructions, SA resources added to DC instructions

## 2022-05-05 NOTE — ED Notes (Signed)
Left messafe for safe transport

## 2022-05-05 NOTE — ED Provider Notes (Signed)
Behavioral Health Urgent Care Medical Screening Exam  Patient Name: Michele Yu MRN: [id number] Date of Evaluation: 05/06/22 Chief Complaint:  substance abuse Diagnosis:  Final diagnoses:  Cocaine abuse with cocaine-induced mood disorder (HCC)    History of Present illness: Michele Yu is a 46 y.o. married female with two children 53 and 13 but has been separated from her husband for several years. Patient presented to [facility] from [facility] ED for substance abuse treatment. Patient reports long history of crack cocaine use. 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 Patient reports that when her and her husband separated due to her infidelity and that is when her substance use started.  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 Patient was treated with benzathine pcn IM injections. Patient was [facility] and brought to [facility] ED by [employer] on 05/04/22 due to suicidal ideations and it was subsequently rescinded.  Patient is alert oriented x3, calm and cooperative during the visit and denies any suicidal ideations or auditory or visual hallucinations, delusions or paranoia and does not appear to be responding to any internal or external stimuli. Patient denies being on any current medications or any previous inpatient treatment. Patient is not in imminent danger to herself or others and can be safely discharged with resources to follow up in the community.   Psychiatric Specialty Exam  Presentation  General Appearance:Disheveled  Eye Contact:Good  Speech:Clear and Coherent  Speech Volume:Normal  Handedness:Right   Mood and Affect  Mood:Anxious  Affect:Appropriate; Congruent   Thought Process  Thought Processes:Coherent; Goal Directed  Descriptions of  Associations:Intact  Orientation:Full (Time, Place and Person)  Thought Content:WDL    Hallucinations:None  Ideas of Reference:None  Suicidal Thoughts:No  Homicidal Thoughts:No   Sensorium  Memory:Immediate Good; Recent Good; Remote Good  Judgment:Fair  Insight:Fair   Executive Functions  Concentration:Good  Attention Span:Good  Recall:Good  Fund of Knowledge:Good  Language:Good   Psychomotor Activity  Psychomotor Activity:Normal   Assets  Assets:Communication Skills; Desire for Improvement; Resilience; Social Support; Vocational/Educational   Sleep  Sleep:Fair  Number of hours: -30   No data recorded  Physical Exam: Physical Exam HENT:     Head: Normocephalic and atraumatic.     Nose: Nose normal.  Eyes:     Pupils: Pupils are equal, round, and reactive to light.  Cardiovascular:     Rate and Rhythm: Normal rate.  Pulmonary:     Effort: Pulmonary effort is normal.  Abdominal:     General: Abdomen is flat.  Musculoskeletal:     Cervical back: Normal range of motion.  Skin:    General: Skin is warm.  Neurological:     Mental Status: She is alert and oriented to person, place, and time.  Psychiatric:        Attention and Perception: Attention normal.        Mood and Affect: Mood is depressed.        Speech: Speech normal.        Behavior: Behavior is cooperative.        Thought Content: Thought content is not paranoid or delusional. Thought content does not include homicidal or suicidal ideation. Thought content does not include homicidal plan.        Cognition and Memory: Cognition normal.  Judgment: Judgment is impulsive.   Review of Systems  Constitutional: Negative.   HENT: Negative.    Eyes: Negative.   Respiratory: Negative.    Cardiovascular: Negative.   Gastrointestinal: Negative.   Genitourinary: Negative.   Musculoskeletal: Negative.   Skin:  Positive for rash.  Neurological: Negative.   Endo/Heme/Allergies:  Negative.   Psychiatric/Behavioral:  Positive for depression.    Blood pressure (!) 138/94, pulse 98, temperature 98 F (36.7 C), temperature source Tympanic, resp. rate 18, SpO2 100 %. There is no height or weight on file to calculate BMI.  Musculoskeletal: Strength & Muscle Tone: within normal limits Gait & Station: normal Patient leans: N/A   [place] [facility] Discharge Disposition for Follow up and Recommendations: Based on my evaluation the patient does not appear to have an emergency medical condition and can be discharged with resources and follow up care in outpatient services for Medication Management and Individual Therapy   Lucia Bitter, NP 05/06/2022, 12:43 AM

## 2022-05-05 NOTE — ED Notes (Signed)
Review of chart in purple zone reveals IVC date of 05/03/2022, which will expire 05/10/2022.

## 2022-05-05 NOTE — Discharge Instructions (Addendum)
It was our pleasure to provide your ER care today - we hope that you feel better.  Go directly to the Behavioral Health Urgent Care Center/Facility-based [facility] now.   Avoid drug use as it is harmful to your physical health and mental well-being - see resource guide attached for treatment programs.   Your syphilis test is positive. You received treatment while in the ER.  You must follow up with the [facility] Department in the next couple of weeks.   Also follow up closely with primary care doctor in the next 1-2 weeks for overall health care, as well as for follow up regarding your blood pressure that is high today.   Return to ER if worse, new symptoms, fevers, new/severe pain, chest pain, trouble breathing, severe abdominal pain, or other concern.

## 2022-05-05 NOTE — Discharge Instructions (Signed)

## 2022-05-05 NOTE — ED Notes (Signed)
Continue to await Safe Transport for ride to [facility]

## 2022-05-05 NOTE — ED Provider Notes (Addendum)
Emergency Medicine Observation Re-evaluation Note  Michele Yu is a 46 y.o. female, seen on rounds today.  Pt initially presented to the ED for complaints of BH evaluation, hx substance use disorder. Pt currently calm, resting, no acute distress. [facility] SW note from last night indicates psych clear for d/c.   Physical Exam  BP (!) 145/98 (BP Location: Left Arm)   Pulse 93   Temp 98.5 F (36.9 C) (Oral)   Resp 18   SpO2 94%  Physical Exam General: resting, calm. Cardiac: regular rate Lungs: breathing comfortably Psych: calm, resting. Is alert/oriented. Pt does not appear acutely depressed or despondent currently. She does acknowledge recent feelings of stress, anxiety and depressed. No current SI or plan - indicates wants to get help, and does not want to harm self. Pts thought processes are clear. Pt is not responding to internal stimuli - no delusions or hallucinations noted.   ED Course / MDM    I have reviewed the labs performed to date as well as medications administered while in observation.  Recent changes in the last 24 hours include ED obs, reassessment.   Plan  [facility] team left note early this AM re psych clear, and another earlier note references taking over to [facility].   Discussed w pt - pt indicates would like to go to [facility].  On review prior labs, RPR was positive.  Pt denies knowledge of prior diagnosis syphilis or tx for same. Confirmatory testing remains pending. However, pt does have a rash that appears c/w syphilis. Pt indicates present for past 1-2 weeks. Not itchy or painful. Pt notes hx unprotected sex. No recent new meds. No mouth sores or genital sores. No vaginal discharge or bleeding. Pt denies dysuria, urgency or other gu c/o.  No abd or cva/flank pain. No fever or chills. Confirmed nkda w pt.   Benzathine PCN LA 2/4 mu given IM.  On psych exam, no acute psychosis, and pt is voluntary - earlier IVC rescinded.   Overall, pt is well, not toxic appearing. Abd soft nt.  Afebrile.   Patient appears stable for transfer/transport to [facility].       Lajean Saver, MD 05/05/22 1455

## 2022-05-05 NOTE — ED Notes (Signed)
Patient cleaned of bowel and bladder incontinence.  Patient not agreeable to get out of bed to get cleaned despite education on why it's important to get OOB.  Patient did however agree to get changed in bed.  Linens changed and new brief applied.

## 2022-05-05 NOTE — ED Provider Notes (Signed)
Ivc paperwork rescinded. Clear from medical perspective.  Hemodynamically stable.   Varney Biles, MD 05/05/22 1553

## 2022-05-05 NOTE — Progress Notes (Signed)
Per Merlyn Lot, NP pt does not meet inpatient criteria. This CSW will now remove pt from the [facility] shift report. TOC to assist with any discharge needs.  Benjaman Kindler, MSW, [facility] 05/05/2022 2:37 AM

## 2022-05-05 NOTE — ED Notes (Signed)
Safe transport called, about 1 hour

## 2022-05-05 NOTE — ED Notes (Signed)
Per RN Santiago Glad C verbal instructions and Dr Rockie Neighbours signatureed, faxed Notice of Commitment Change to [facility], copy in medical records, and original in Clerk/Magistrate file in purple zone.

## 2022-05-05 NOTE — ED Notes (Signed)
Patient argumentative with sitter about cleaning herself up.  Patient yelling and cursing sitter.  Patient able to be redirected, boundaries set and patient agreed not to curse at staff that were trying to help her.  Patient back in bed, watching TV.

## 2022-05-07 LAB — T.PALLIDUM AB, TOTAL: T Pallidum Abs: REACTIVE — AB

## 2022-12-05 ENCOUNTER — Encounter (HOSPITAL_COMMUNITY): Payer: Self-pay

## 2022-12-05 ENCOUNTER — Other Ambulatory Visit (HOSPITAL_COMMUNITY): Payer: Self-pay

## 2022-12-05 ENCOUNTER — Emergency Department (HOSPITAL_COMMUNITY)
Admission: EM | Admit: 2022-12-05 | Discharge: 2022-12-05 | Disposition: A | Discharge status: Home / Self Care | Payer: Medicaid Other | Attending: Student | Admitting: Student

## 2022-12-05 DIAGNOSIS — N760 Acute vaginitis: Secondary | ICD-10-CM

## 2022-12-05 DIAGNOSIS — N7689 Other specified inflammation of vagina and vulva: Secondary | ICD-10-CM | POA: Diagnosis present

## 2022-12-05 DIAGNOSIS — A64 Unspecified sexually transmitted disease: Secondary | ICD-10-CM

## 2022-12-05 DIAGNOSIS — F17210 Nicotine dependence, cigarettes, uncomplicated: Secondary | ICD-10-CM | POA: Diagnosis not present

## 2022-12-05 DIAGNOSIS — I10 Essential (primary) hypertension: Secondary | ICD-10-CM | POA: Diagnosis not present

## 2022-12-05 HISTORY — DX: Syphilis, unspecified: A53.9

## 2022-12-05 LAB — RAPID HIV SCREEN (HIV 1/2 AB+AG)
HIV 1/2 Antibodies: NONREACTIVE
HIV-1 P24 Antigen - HIV24: NONREACTIVE

## 2022-12-05 LAB — WET PREP, GENITAL
Clue Cells Wet Prep HPF POC: NONE SEEN
Sperm: NONE SEEN
WBC, Wet Prep HPF POC: <10 (ref <10)
Yeast Wet Prep HPF POC: NONE SEEN

## 2022-12-05 LAB — I-STAT BETA HCG BLOOD, ED (MC, WL, AP ONLY): I-stat hCG, quantitative: <5 m[IU]/mL (ref <5)

## 2022-12-05 MED ORDER — DOXYCYCLINE HYCLATE 100 MG PO TABS
100.0000 mg | ORAL_TABLET | Freq: Once | ORAL | Status: AC
  Administered 2022-12-05: 100 mg via ORAL
  Filled 2022-12-05: qty 1

## 2022-12-05 MED ORDER — DOXYCYCLINE HYCLATE 100 MG PO TABS
100.0000 mg | ORAL_TABLET | Freq: Two times a day (BID) | ORAL | 0 refills | Status: AC
  Filled 2022-12-05: qty 28, 14d supply, fill #0

## 2022-12-05 MED ORDER — HYDROCODONE-ACETAMINOPHEN 5-325 MG PO TABS
1.0000 | ORAL_TABLET | Freq: Once | ORAL | Status: AC
  Administered 2022-12-05: 1 via ORAL
  Filled 2022-12-05: qty 1

## 2022-12-05 MED ORDER — LIDOCAINE HCL (PF) 1 % IJ SOLN: 1.0000 mL | Freq: Once | INTRAMUSCULAR | Status: DC

## 2022-12-05 MED ORDER — METRONIDAZOLE 500 MG PO TABS
500.0000 mg | ORAL_TABLET | Freq: Once | ORAL | Status: AC
  Administered 2022-12-05: 500 mg via ORAL
  Filled 2022-12-05: qty 1

## 2022-12-05 MED ORDER — METRONIDAZOLE 500 MG PO TABS
500.0000 mg | ORAL_TABLET | Freq: Two times a day (BID) | ORAL | 0 refills | Status: AC
  Filled 2022-12-05: qty 14, 7d supply, fill #0

## 2022-12-05 MED ORDER — VALACYCLOVIR HCL 500 MG PO TABS
1000.0000 mg | ORAL_TABLET | Freq: Once | ORAL | Status: AC
  Administered 2022-12-05: 1000 mg via ORAL
  Filled 2022-12-05: qty 2

## 2022-12-05 MED ORDER — PENICILLIN G BENZATHINE 1200000 UNIT/2ML IM SUSY
2.4000 10*6.[IU] | PREFILLED_SYRINGE | Freq: Once | INTRAMUSCULAR | Status: AC
  Administered 2022-12-05: 2.4 10*6.[IU] via INTRAMUSCULAR
  Filled 2022-12-05: qty 4

## 2022-12-05 MED ORDER — LIDOCAINE HCL (PF) 1 % IJ SOLN
1.0000 mL | Freq: Once | INTRAMUSCULAR | Status: AC
  Administered 2022-12-05: 1 mL

## 2022-12-05 MED ORDER — FLUCONAZOLE 200 MG PO TABS
200.0000 mg | ORAL_TABLET | Freq: Once | ORAL | 0 refills | Status: AC | PRN
  Filled 2022-12-05: qty 1, 1d supply, fill #0

## 2022-12-05 MED ORDER — CEFTRIAXONE SODIUM 500 MG IJ SOLR
500.0000 mg | Freq: Once | INTRAMUSCULAR | Status: AC
  Administered 2022-12-05: 500 mg via INTRAMUSCULAR
  Filled 2022-12-05: qty 500

## 2022-12-05 MED ORDER — VALACYCLOVIR HCL 1 G PO TABS
1000.0000 mg | ORAL_TABLET | Freq: Two times a day (BID) | ORAL | 0 refills | Status: AC
  Filled 2022-12-05: qty 14, 7d supply, fill #0

## 2022-12-05 NOTE — ED Provider Triage Note (Signed)
Emergency Medicine Provider Triage Evaluation Note  Michele Yu , a 47 y.o. female  was evaluated in triage.  Pt complains of vaginal irritation.  Started 2 weeks ago.  Denies abnormal vaginal discharge bleeding or abdominal pain.  States she was diagnosed with syphilis last year and treated.  Review of Systems  Positive: See above Negative: See above  Physical Exam  BP 127/83 (BP Location: Right Arm)   Pulse 75   Temp 98.5 F (36.9 C) (Oral)   Resp 18   SpO2 100%  Gen:   Awake, no distress   Resp:  Normal effort  MSK:   Moves extremities without difficulty  Other:    Medical Decision Making  Medically screening exam initiated at 1:52 PM.  Appropriate orders placed.  Michele Yu was informed that the remainder of the evaluation will be completed by another provider, this initial triage assessment does not replace that evaluation, and the importance of remaining in the ED until their evaluation is complete.  Work up started   Michele Eagle, PA-C 12/05/22 1353

## 2022-12-05 NOTE — ED Provider Notes (Signed)
[place] EMERGENCY DEPARTMENT AT [facility] Provider Note  CSN: [id number] Arrival date & time: 12/05/22 1254  Chief Complaint(s) No chief complaint on file.  HPI Michele Yu is a 47 y.o. female with PMH anxiety, panic attacks, genital herpes, syphilis who presents emergency room for evaluation of vaginal pain and irritation.  She states that she is unsure of the total duration of her symptoms but feels like they have been worsening over the last 1 month.  She states that pain has significant worsened over the last 1 week and comes to the emergency department for further evaluation.  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  Denies any oral pain or ulcers.  Endorses dysuria but denies chest pain, shortness of breath, headache, fever or other systemic symptoms.  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   Marijuana dependence 02/14/2019   Behavioral change 02/14/2019   High risk medication use 09/09/2018   Vitamin D deficiency 09/09/2018   Irritable 02/26/2017   Other headache syndrome 03/07/2016   Disturbed cognition 03/07/2016   Urinary incontinence 01/20/2016   Ataxic gait 12/23/2015   Chronic migraine w/o aura w/o status migrainosus, not intractable 12/23/2015   Loss of weight 12/23/2015   Abnormal brain MRI    Abnormal finding in CSF    Diplopia    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 Authorizing Provider  azaTHIOprine (IMURAN) 50 MG tablet One po qAM and two po qHS Patient not taking: Reported on 05/04/2022 03/10/19   Asa Lente, MD  buPROPion (WELLBUTRIN XL) 300 MG 24 hr tablet Take 1 tablet (300 mg total) by mouth daily. Patient not taking: Reported on 02/14/2019 09/09/18   Asa Lente, MD  oxybutynin (DITROPAN) 5 MG tablet Take 1 tablet (5 mg total) by mouth 2 (two) times daily. Patient not taking: Reported on 02/14/2019 01/09/18   Asa Lente, MD  predniSONE (DELTASONE) 10 MG tablet Take 2 tablets (20 mg total) by mouth daily with breakfast. Patient not taking: Reported on 02/14/2019 09/09/18   Asa Lente, MD  traZODone (DESYREL) 50 MG tablet One or two at bedtime Patient not taking: Reported on 05/04/2022 03/10/19   Sater, Pearletha Furl, MD  Vitamin D, Ergocalciferol, (DRISDOL) 1.25 MG (50000 UT) CAPS capsule Take 1 capsule (50,000 Units total) by mouth every 7 (seven) days. Patient not taking: Reported on 02/14/2019 09/15/18   Asa Lente, MD  Past Surgical History Past Surgical History:  Procedure Laterality Date   APPENDECTOMY  1992   DILATION AND CURETTAGE OF UTERUS     S/P "miscarriage"   Family History Family History  Problem Relation Age of Onset   Heart attack Father     Social History Social History   Tobacco Use   Smoking status: Every Day    Packs/day: 1.00    Years: 19.00    Additional pack years: 0.00    Total pack years: 19.00    Types: Cigarettes   Smokeless tobacco: Never  Vaping Use   Vaping Use: Former  Substance Use Topics   Alcohol use: Yes    Alcohol/week: 14.0 standard drinks of alcohol    Types: 14 Glasses of wine per week    Comment: drinks 40oz beer daily.   Drug use: Yes     Types: Marijuana   Allergies Patient has no known allergies.  Review of Systems Review of Systems  Genitourinary:  Positive for dysuria, vaginal discharge and vaginal pain.  Skin:  Positive for rash.    Physical Exam Vital Signs  I have reviewed the triage vital signs BP 127/83 (BP Location: Right Arm)   Pulse 75   Temp 98.5 F (36.9 C) (Oral)   Resp 18   SpO2 100%   Physical Exam Vitals and nursing note reviewed.  Constitutional:      General: She is not in acute distress.    Appearance: She is well-developed.  HENT:     Head: Normocephalic and atraumatic.  Eyes:     Conjunctiva/sclera: Conjunctivae normal.  Cardiovascular:     Rate and Rhythm: Normal rate and regular rhythm.     Heart sounds: No murmur heard. Pulmonary:     Effort: Pulmonary effort is normal. No respiratory distress.     Breath sounds: Normal breath sounds.  Abdominal:     Palpations: Abdomen is soft.     Tenderness: There is no abdominal tenderness.  Genitourinary:    Comments: Multiple lesions on vaginal canal and into the perineum  Musculoskeletal:        General: No swelling.     Cervical back: Neck supple.  Skin:    General: Skin is warm and dry.     Capillary Refill: Capillary refill takes less than 2 seconds.  Neurological:     Mental Status: She is alert.  Psychiatric:        Mood and Affect: Mood normal.     ED Results and Treatments Labs (all labs ordered are listed, but only abnormal results are displayed) Labs Reviewed  WET PREP, GENITAL  RPR  RAPID HIV SCREEN (HIV 1/2 AB+AG)  I-STAT BETA HCG BLOOD, ED ([facility], [facility], [facility] ONLY)  GC/CHLAMYDIA PROBE AMP ([place]) NOT AT [facility]                                                                                                                          Radiology No  results found.  Pertinent labs & imaging results that were available during my care of the patient were reviewed by me and considered in my medical decision making  (see MDM for details).  Medications Ordered in ED Medications  HYDROcodone-acetaminophen (NORCO/VICODIN) 5-325 MG per tablet 1 tablet (has no administration in time range)                                                                                                                                     Procedures Procedures  (including critical care time)  Medical Decision Making / ED Course   This patient presents to the ED for concern of vaginal irritation, this involves an extensive number of treatment options, and is a complaint that carries with it a high risk of complications and morbidity.  The differential diagnosis includes herpes labialis, lymphogranuloma venereum, secondary syphilis with gumma, primary syphilis chancre, condyloma  MDM: Patient seen emergency room for evaluation of vaginal irritation.  Physical exam is highly concerning with multiple lesions over bilateral labia that appear ulcerative and extend into the perineum.  Presentation is concerning for likely multiple STIs including condyloma, possible concomitant syphilis.  Self swab performed and patient positive for trichomonas.  Patient with significant difficulty following up in the outpatient setting and thus was empirically treated with penicillin for syphilis, Valtrex, ceftriaxone doxycycline for gonorrhea chlamydia and Flagyl for trichomonas.  I consulted the transitions of care team to help the patient set up outpatient follow-up as she has not had a Pap smear and given the presence of condyloma, patient is at  high risk for cervical cancer.  Medications brought to patient's bedside by the transitions of care pharmacy team and patient discharged with outpatient follow-up.   Additional history obtained:  -External records from outside source obtained and reviewed including: Chart review including previous notes, labs, imaging, consultation notes   Lab Tests: -I ordered, reviewed, and interpreted labs.   The  pertinent results include:   Labs Reviewed  WET PREP, GENITAL  RPR  RAPID HIV SCREEN (HIV 1/2 AB+AG)  I-STAT BETA HCG BLOOD, ED ([facility], [facility], [facility] ONLY)  GC/CHLAMYDIA PROBE AMP ([place]) NOT AT [facility]       Medicines ordered and prescription drug management: Meds ordered this encounter  Medications   HYDROcodone-acetaminophen (NORCO/VICODIN) 5-325 MG per tablet 1 tablet    -I have reviewed the patients home medicines and have made adjustments as needed  Critical interventions none  Consultations Obtained: I requested consultation with the transitions of care team,  and discussed lab and imaging findings as well as pertinent plan - they recommend: Assistance with outpatient follow-up   Cardiac Monitoring: The patient was maintained on a cardiac monitor.  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  Patient would highly benefit from being seen in the outpatient setting by internal medicine providers or OB/GYN providers.     Final Clinical Impression(s) / ED Diagnoses Final diagnoses:  None     @    Glendora Score, MD 12/05/22 2322

## 2022-12-05 NOTE — Progress Notes (Addendum)
TOC CSW attempted to arouse pt on 3 occasions.  Pt would wake up for a couple of seconds and fall back to sleep.  CSW then asked pt to sit up so we could talk and pt agreed to and then fell back to sleep.  CSW shared with RN pts behavior.    CSW attached resources to pts AVS.  Dreux Mcgroarty Tarpley-Carter, MSW, LCSW-A Pronouns:  She/Her/Hers [facility] HealthTransitions of Care Clinical Social Worker Direct Number:  [phone] [email or web address]

## 2022-12-05 NOTE — ED Triage Notes (Signed)
Pt states was diagnosed with syphilis last year, state she is having an outbreak, states she is having genital pain and itching, open sores

## 2022-12-06 LAB — RPR
RPR Ser Ql: REACTIVE — AB
RPR Titer: 1:64 {titer}

## 2022-12-06 LAB — GC/CHLAMYDIA PROBE AMP (~~LOC~~) NOT AT ARMC
Chlamydia: NEGATIVE
Comment: NEGATIVE
Comment: NORMAL
Neisseria Gonorrhea: POSITIVE — AB

## 2022-12-07 LAB — T.PALLIDUM AB, TOTAL: T Pallidum Abs: REACTIVE — AB

## 2023-10-19 DEATH — deceased
